# Patient Record
Sex: Female | Born: 1995 | Race: Black or African American | Hispanic: No | Marital: Single | Smoking: Never smoker
Health system: Southern US, Community
[De-identification: ages and names within clinical notes are randomized; demographics above are authoritative.]

## PROBLEM LIST (undated history)

## (undated) DIAGNOSIS — K219 Gastro-esophageal reflux disease without esophagitis: Secondary | ICD-10-CM

---
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
## 2014-11-16 ENCOUNTER — Emergency Department (HOSPITAL_COMMUNITY)
Admission: EM | Admit: 2014-11-16 | Discharge: 2014-11-16 | Disposition: A | Discharge status: Home / Self Care | Payer: 59 | Attending: Emergency Medicine | Admitting: Emergency Medicine

## 2014-11-16 ENCOUNTER — Emergency Department (HOSPITAL_COMMUNITY): Payer: 59

## 2014-11-16 ENCOUNTER — Encounter (HOSPITAL_COMMUNITY): Payer: Self-pay | Admitting: *Deleted

## 2014-11-16 DIAGNOSIS — K5900 Constipation, unspecified: Secondary | ICD-10-CM | POA: Insufficient documentation

## 2014-11-16 DIAGNOSIS — R109 Unspecified abdominal pain: Secondary | ICD-10-CM | POA: Diagnosis present

## 2014-11-16 DIAGNOSIS — Z3202 Encounter for pregnancy test, result negative: Secondary | ICD-10-CM | POA: Diagnosis not present

## 2014-11-16 DIAGNOSIS — K219 Gastro-esophageal reflux disease without esophagitis: Secondary | ICD-10-CM | POA: Insufficient documentation

## 2014-11-16 DIAGNOSIS — IMO0001 Reserved for inherently not codable concepts without codable children: Secondary | ICD-10-CM

## 2014-11-16 HISTORY — DX: Gastro-esophageal reflux disease without esophagitis: K21.9

## 2014-11-16 LAB — URINALYSIS, ROUTINE W REFLEX MICROSCOPIC
Bilirubin Urine: NEGATIVE
GLUCOSE, UA: NEGATIVE mg/dL
HGB URINE DIPSTICK: NEGATIVE
Ketones, ur: NEGATIVE mg/dL
LEUKOCYTES UA: NEGATIVE
NITRITE: NEGATIVE
Protein, ur: NEGATIVE mg/dL
SPECIFIC GRAVITY, URINE: 1.022 (ref 1.005–1.030)
UROBILINOGEN UA: 1 mg/dL (ref 0.0–1.0)
pH: 6 (ref 5.0–8.0)

## 2014-11-16 LAB — POC URINE PREG, ED: Preg Test, Ur: NEGATIVE

## 2014-11-16 MED ORDER — DICYCLOMINE HCL 10 MG/ML IM SOLN
20.0000 mg | Freq: Once | INTRAMUSCULAR | Status: AC
  Administered 2014-11-16: 20 mg via INTRAMUSCULAR
  Filled 2014-11-16: qty 2

## 2014-11-16 MED ORDER — OMEPRAZOLE 20 MG PO CPDR: 20.0000 mg | DELAYED_RELEASE_CAPSULE | Freq: Every day | ORAL | Status: DC

## 2014-11-16 MED ORDER — GI COCKTAIL ~~LOC~~
30.0000 mL | Freq: Once | ORAL | Status: AC
  Administered 2014-11-16: 30 mL via ORAL
  Filled 2014-11-16: qty 30

## 2014-11-16 NOTE — ED Notes (Signed)
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 C/o abd cramping x 15 minutes prior to calling EMS. Patient denies any chance of pregnancy. Denies medical problems. Denies N/V/D.

## 2014-11-16 NOTE — ED Notes (Signed)
Bed: WA09 Expected date:  Expected time:  Means of arrival:  Comments: abd pain 

## 2014-11-16 NOTE — ED Provider Notes (Signed)
CSN: [id number]     Arrival date & time 11/16/14  0213 History  By signing my name below, I, Emmanuella Mensah, attest that this documentation has been prepared under the direction and in the presence of Azul Coffie, MD. Electronically Signed: Angelene GiovanniEmmanuella Mensah, ED Scribe. 11/16/2014. 3:07 AM.   Chief Complaint  Patient presents with  . Abdominal Cramping   Patient is a 19 y.o. female presenting with cramps. The history is provided by the patient. No language interpreter was used.  Abdominal Cramping This is a new problem. The current episode started 2 days ago. The problem has been gradually worsening. Associated symptoms include abdominal pain. Pertinent negatives include no shortness of breath. The symptoms are aggravated by eating. Nothing relieves the symptoms. She has tried nothing for the symptoms.   HPI Comments: Lauralee EvenerDiamond Menning is a 19 y.o. female with a hx of Acid reflux disease who presents to the Emergency Department complaining of a gradually worsening intermittent cramping abdominal pain onset 2 days ago. She denies any n/v/d. Pt reports that she has not been on her GERD medication for years. She denies straining during her last BM several hours ago. She reports that she had a beans and rice, philly cheese steak, fried chicken, and cabbage. No alleviating factors noted.   Past Medical History  Diagnosis Date  . Acid reflux disease    History reviewed. No pertinent past surgical history. Family History  Problem Relation Age of Onset  . Thyroid disease Mother   . Diabetes Father   . Stroke Paternal Grandmother   . Diabetes Paternal Grandmother    Social History  Substance Use Topics  . Smoking status: Never Smoker   . Smokeless tobacco: None  . Alcohol Use: None   OB History    No data available     Review of Systems  Constitutional: Negative for fever.  Respiratory: Negative for shortness of breath.   Gastrointestinal: Positive for abdominal pain. Negative for  nausea, vomiting and diarrhea.  Genitourinary: Negative for dysuria and hematuria.  All other systems reviewed and are negative.     Allergies  Review of patient's allergies indicates not on file.  Home Medications   Prior to Admission medications   Not on File   BP 117/62 mmHg  Pulse 87  Temp(Src) 98.1 F (36.7 C) (Oral)  Resp 16  SpO2 100% Physical Exam  Constitutional: She is oriented to person, place, and time. She appears well-developed and well-nourished. No distress.  HENT:  Head: Normocephalic and atraumatic.  Mouth/Throat: Oropharynx is clear and moist. No oropharyngeal exudate.  Eyes: Conjunctivae and EOM are normal. Pupils are equal, round, and reactive to light.  Neck: Neck supple. No tracheal deviation present.  Cardiovascular: Normal rate and regular rhythm.   Pulmonary/Chest: Effort normal and breath sounds normal. No respiratory distress.  Abdominal: Soft. There is no rebound and no guarding.  Gas Constipation in the transverse colon  Musculoskeletal: Normal range of motion.  Neurological: She is alert and oriented to person, place, and time.  Skin: Skin is warm and dry.  Psychiatric: She has a normal mood and affect. Her behavior is normal.  Nursing note and vitals reviewed.   ED Course  Procedures (including critical care time) DIAGNOSTIC STUDIES: Oxygen Saturation is 100% on RA, normal by my interpretation.    COORDINATION OF CARE: 3:05 AM- Pt advised of plan for treatment and pt agrees.   Labs Review Labs Reviewed  URINALYSIS, ROUTINE W REFLEX MICROSCOPIC (NOT AT [facility])  POC  URINE PREG, ED   Yardley Beltran, MD has personally reviewed and evaluated these lab results as part of her medical decision-making.   EKG Interpretation None      MDM   Final diagnoses:  None   Results for orders placed or performed during the hospital encounter of 11/16/14  Urinalysis, Routine w reflex microscopic (not at [facility])  Result Value Ref Range   Color,  Urine YELLOW YELLOW   APPearance CLEAR CLEAR   Specific Gravity, Urine 1.022 1.005 - 1.030   pH 6.0 5.0 - 8.0   Glucose, UA NEGATIVE NEGATIVE mg/dL   Hgb urine dipstick NEGATIVE NEGATIVE   Bilirubin Urine NEGATIVE NEGATIVE   Ketones, ur NEGATIVE NEGATIVE mg/dL   Protein, ur NEGATIVE NEGATIVE mg/dL   Urobilinogen, UA 1.0 0.0 - 1.0 mg/dL   Nitrite NEGATIVE NEGATIVE   Leukocytes, UA NEGATIVE NEGATIVE  POC Urine Pregnancy, ED (do NOT order at [facility])  Result Value Ref Range   Preg Test, Ur NEGATIVE NEGATIVE   Dg Abd Acute W/chest  11/16/2014  CLINICAL DATA:  Cramping abdominal pain for 2 days, worse this morning. History of gastric reflux. EXAM: DG ABDOMEN ACUTE W/ 1V CHEST COMPARISON:  None. FINDINGS: Normal heart size and pulmonary vascularity. No focal airspace disease or consolidation in the lungs. No blunting of costophrenic angles. No pneumothorax. Mediastinal contours appear intact. Scattered gas and stool in the colon. No small or large bowel distention. No free intra-abdominal air. No abnormal air-fluid levels. No radiopaque stones. Visualized bones appear intact. IMPRESSION: No evidence of active pulmonary disease. Normal nonobstructive bowel gas pattern. Electronically Signed   By: Burman Nieves M.D.   On: 11/16/2014 03:40      Medications  gi cocktail (Maalox,Lidocaine,Donnatal) (30 mLs Oral Given 11/16/14 0335)  dicyclomine (BENTYL) injection 20 mg (20 mg Intramuscular Given 11/16/14 0335)  GERD with excessive gas likely related to diet.  Will restart omeprazole and have advised gas x and gerd friendly diet.  Strict return precautions.    I personally performed the services described in this documentation, which was scribed in my presence. The recorded information has been reviewed and is accurate.     Cy Blamer, MD 11/16/14 [phone]

## 2017-06-26 IMAGING — CR DG ABDOMEN ACUTE W/ 1V CHEST
3 series · 3 of 3 positions shown · non-contrast
Comparison: None.

CLINICAL DATA: Cramping abdominal pain for 2 days, worse this
morning. History of gastric reflux.

EXAM:
DG ABDOMEN ACUTE W/ 1V CHEST

[w chest pa]
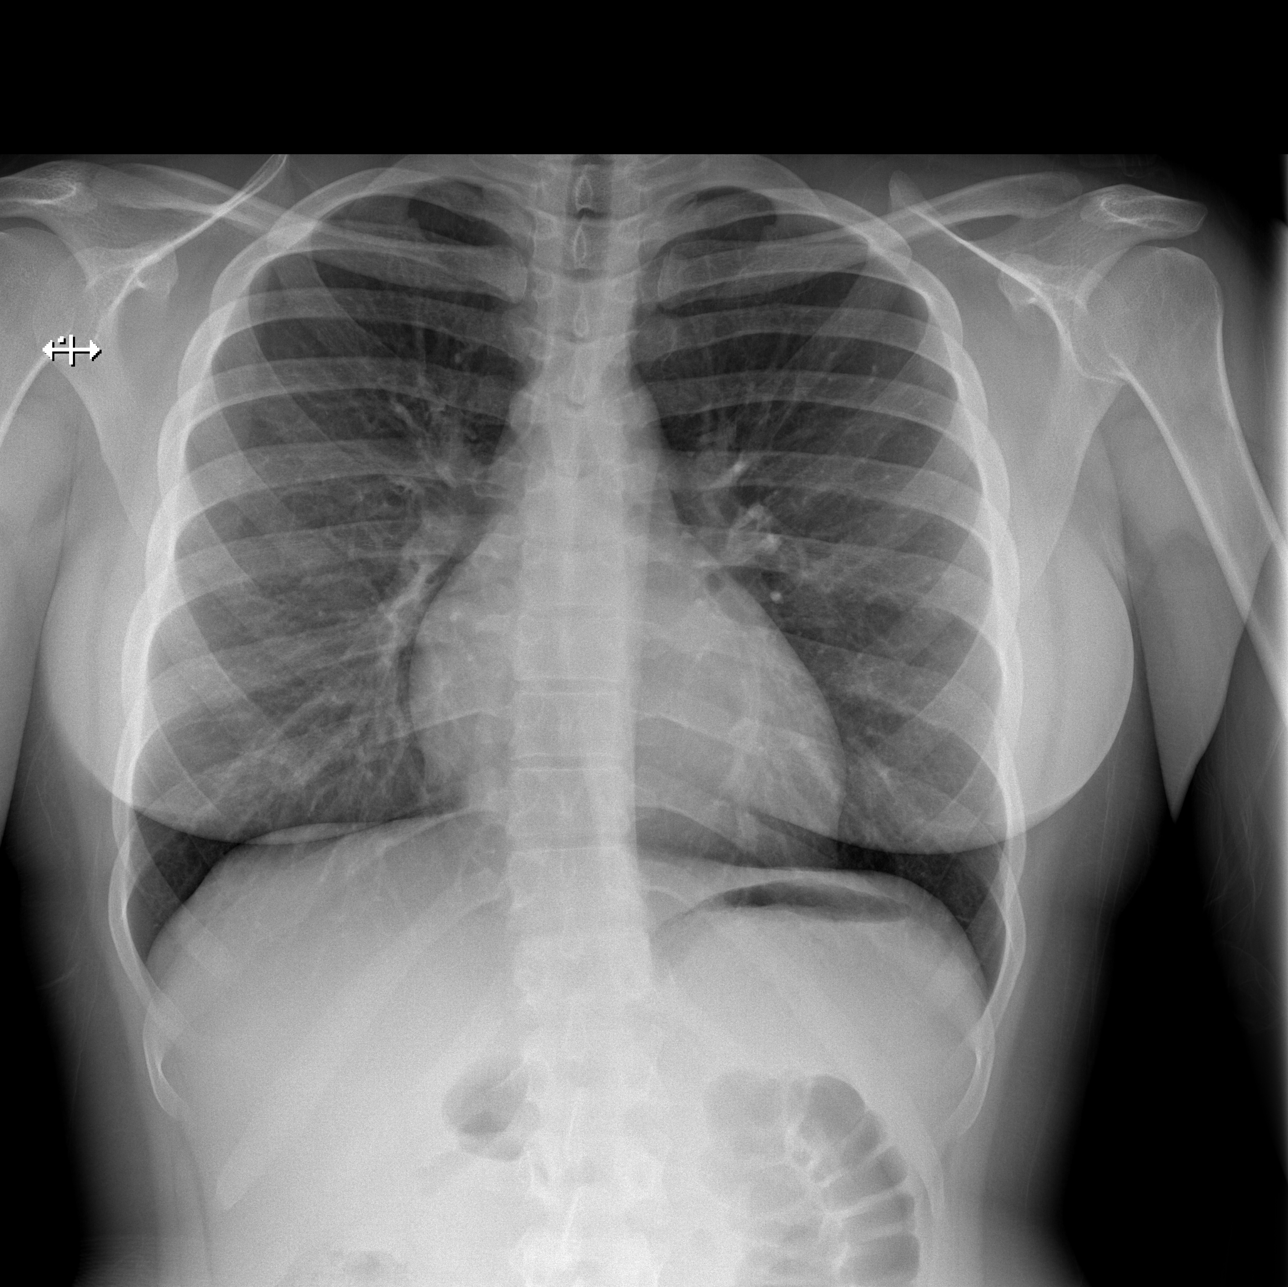

[w abdomen upright]
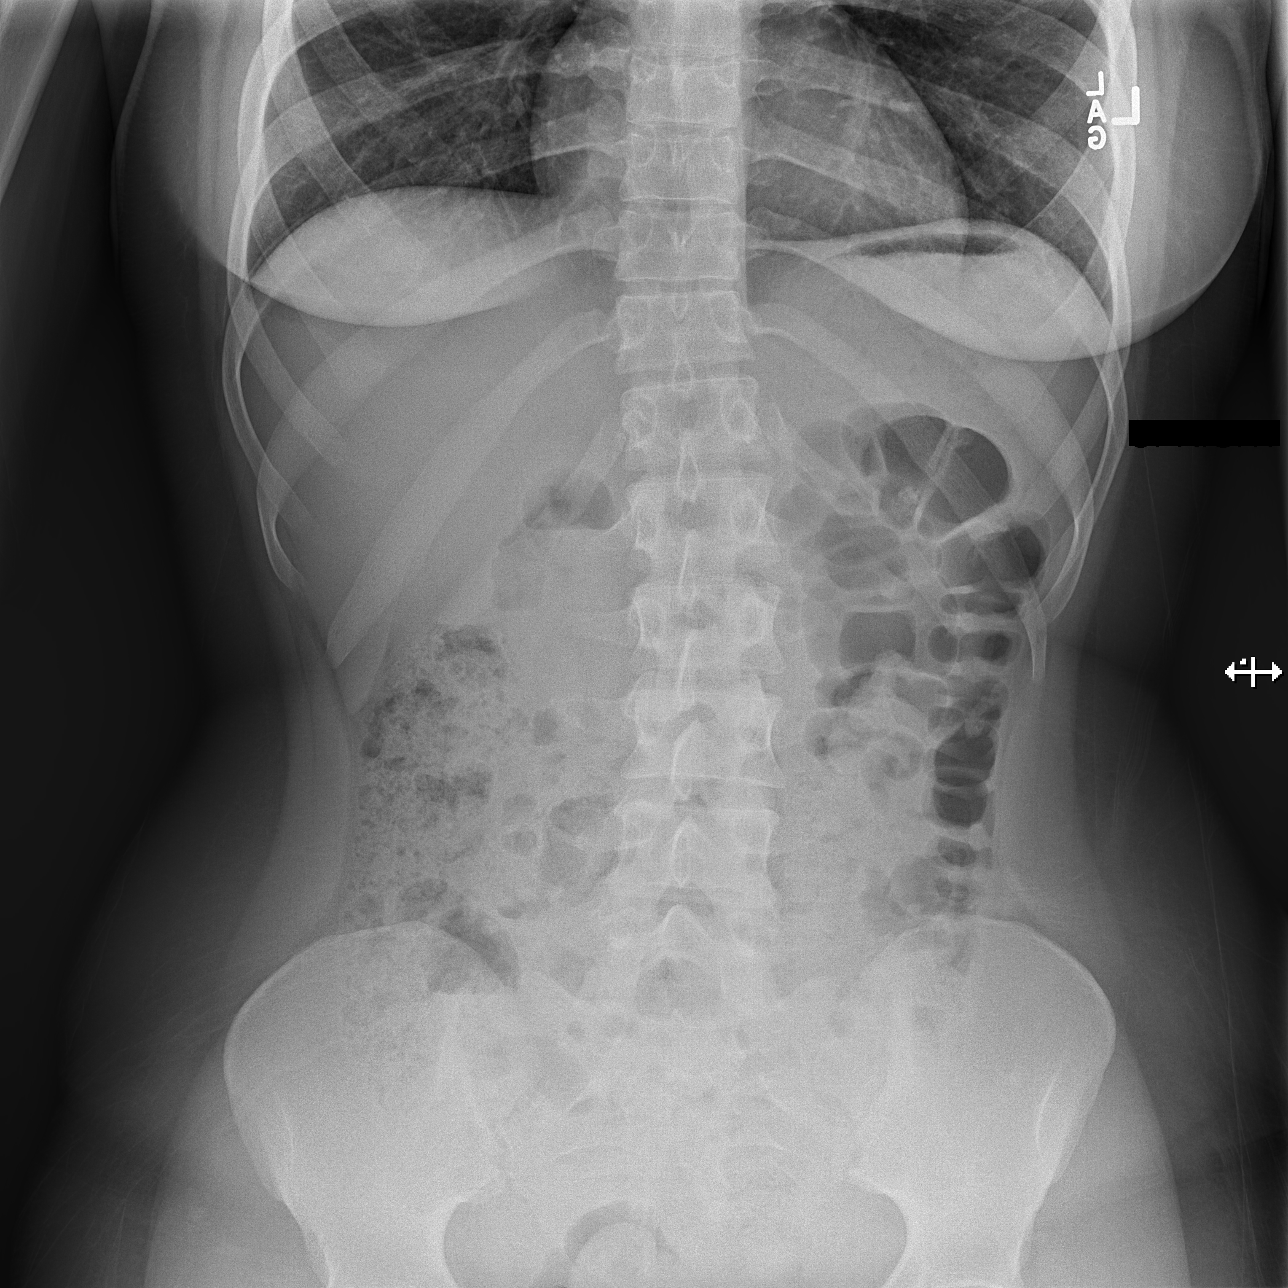

[t abdomen supine]
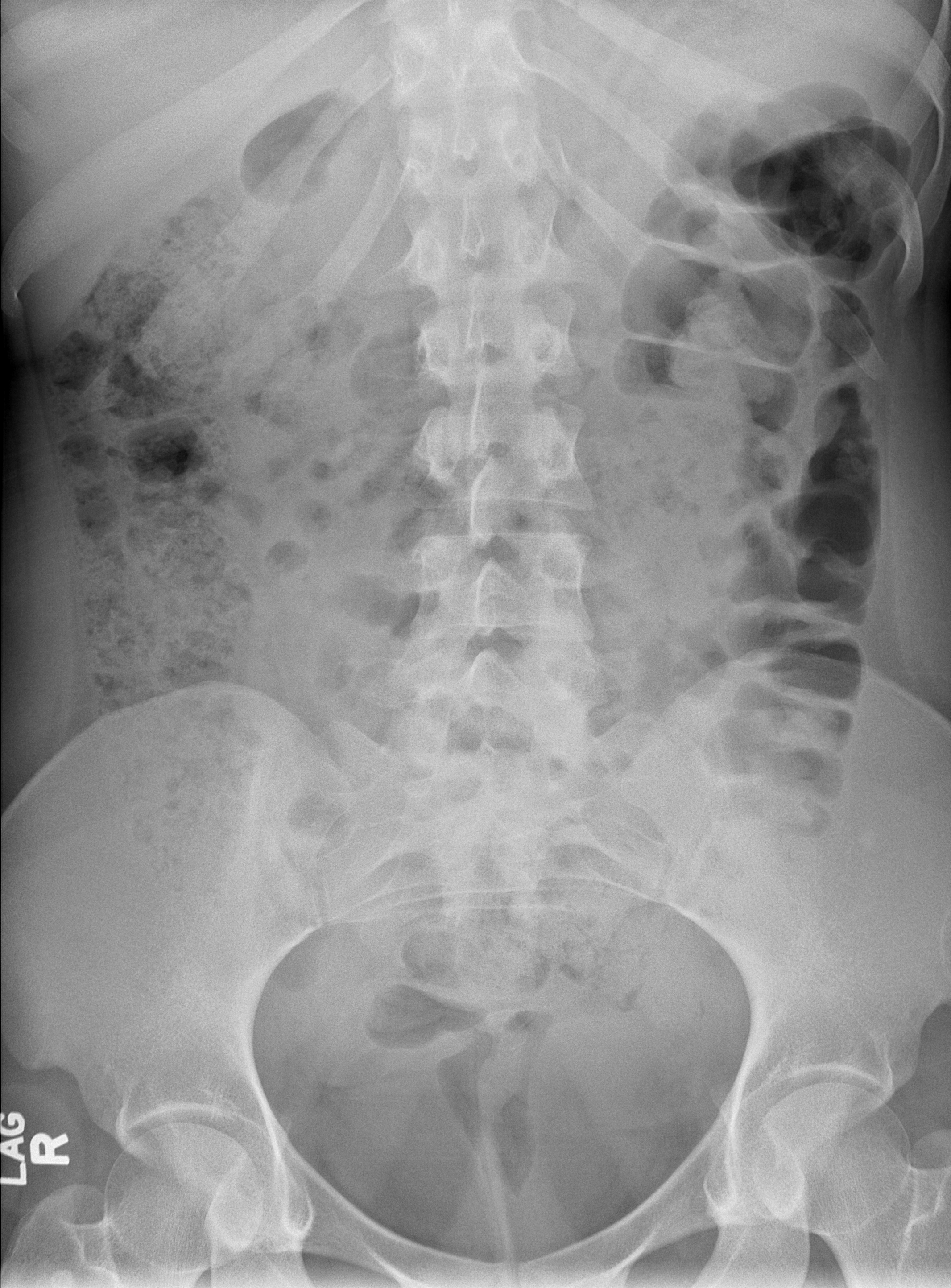

[3 of 3 positions shown; findings below may reference images not displayed]

FINDINGS: Normal heart size and pulmonary vascularity. No focal airspace
disease or consolidation in the lungs. No blunting of costophrenic
angles. No pneumothorax. Mediastinal contours appear intact.

Scattered gas and stool in the colon. No small or large bowel
distention. No free intra-abdominal air. No abnormal air-fluid
levels. No radiopaque stones. Visualized bones appear intact.
IMPRESSION: No evidence of active pulmonary disease. Normal nonobstructive bowel
gas pattern.

## 2018-02-24 ENCOUNTER — Ambulatory Visit (HOSPITAL_COMMUNITY)
Admission: EM | Admit: 2018-02-24 | Discharge: 2018-02-24 | Disposition: A | Discharge status: Home / Self Care | Payer: 59 | Attending: Family | Admitting: Family

## 2018-02-24 ENCOUNTER — Encounter (HOSPITAL_COMMUNITY): Payer: Self-pay | Admitting: Emergency Medicine

## 2018-02-24 DIAGNOSIS — F419 Anxiety disorder, unspecified: Secondary | ICD-10-CM

## 2018-02-24 DIAGNOSIS — F410 Panic disorder [episodic paroxysmal anxiety] without agoraphobia: Secondary | ICD-10-CM

## 2018-02-24 DIAGNOSIS — G44221 Chronic tension-type headache, intractable: Secondary | ICD-10-CM

## 2018-02-24 MED ORDER — ESCITALOPRAM OXALATE 10 MG PO TABS: 10.0000 mg | ORAL_TABLET | Freq: Every day | ORAL | 0 refills | Status: DC

## 2018-02-24 NOTE — Discharge Instructions (Signed)
Recommend trial Lexapro 10mg  tablet - take one tablet daily with food. Review stress-reducing techniques. Try to limit caffeine intake. May take Aleve as needed for headaches. Recommend contact your PCP or GYN for re-evaluation in 3 to 4 weeks.

## 2018-02-24 NOTE — ED Triage Notes (Signed)
Pt states "I had a really bad panic attack at work today, i've had small ones but this one was bad". Pt appears in NAD.

## 2018-02-24 NOTE — ED Provider Notes (Signed)
[facility]    CSN: [id number] Arrival date & time: 02/24/18  1028     History   Chief Complaint Chief Complaint  Patient presents with  . Anxiety    HPI Vanessa Bender is a 23 y.o. female.   23 year old female presents with panic attack that occurred at work this morning. 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 There are only a few workers and many tasks and she often feels overwhelmed. Today- she started feeling stressed and started crying. Then she hyperventilated and was hot and shaking. She also experienced palpitations and a dull headache. Symptoms lasted for at least 30 minutes except headache which she is still experiencing. This has been her longest panic attack. Previous panic attacks have lasted about 20 minutes but she has had 2 in the past 3 months. She denies any vision changes, chest pain, abdominal pain or nausea. She does have a history of chronic tension headaches. She has tested positive on a depression scale at her last GYN routine visit and she was offered Prozac in the past but she never took it. She is concerned about side effects with any medication. She has multiple questions about anxiety and mood and various options for treatment. No other health issues. Takes no daily medication. She does not use tobacco or drink alcohol but does consume moderate amount of caffeine daily. No illicit drug use.   The history is provided by the patient.    Past Medical History:  Diagnosis Date  . Acid reflux disease     There are no active problems to display for this patient.   History reviewed. No pertinent surgical history.  OB History   No obstetric history on file.      Home Medications    Prior to Admission medications   Medication Sig Start Date End Date Taking? Authorizing Provider  escitalopram (LEXAPRO) 10 MG tablet Take 1 tablet (10 mg total) by mouth daily. 02/24/18   Sudie GrumblingAmyot, Mabel Roll Berry,  NP  ibuprofen (ADVIL,MOTRIN) 200 MG tablet Take 400 mg by mouth every 8 (eight) hours as needed (for  pain.).    [provider]    Family History Family History  Problem Relation Age of Onset  . Thyroid disease Mother   . Diabetes Father   . Stroke Paternal Grandmother   . Diabetes Paternal Grandmother     Social History Social History   Tobacco Use  . Smoking status: Never Smoker  Substance Use Topics  . Alcohol use: Not on file  . Drug use: Not on file     Allergies   Patient has no known allergies.   Review of Systems Review of Systems  Constitutional: Negative for activity change, appetite change, chills, diaphoresis, fatigue and fever.  HENT: Negative for congestion, facial swelling, mouth sores, nosebleeds, postnasal drip, sore throat, tinnitus and trouble swallowing.   Eyes: Negative for photophobia and visual disturbance.  Respiratory: Positive for shortness of breath (hyperventilating during panic attack). Negative for cough, chest tightness and wheezing.   Cardiovascular: Positive for palpitations. Negative for chest pain and leg swelling.  Gastrointestinal: Negative for abdominal pain, diarrhea, nausea and vomiting.  Musculoskeletal: Negative for arthralgias, back pain, myalgias, neck pain and neck stiffness.  Skin: Negative for color change, rash and wound.  Allergic/Immunologic: Negative for environmental allergies, food allergies and immunocompromised state.  Neurological: Positive for light-headedness and headaches. Negative for dizziness, tremors, seizures, syncope, facial asymmetry,  speech difficulty, weakness and numbness.  Hematological: Negative for adenopathy. Does not bruise/bleed easily.  Psychiatric/Behavioral: Negative for hallucinations, self-injury, sleep disturbance and suicidal ideas. The patient is nervous/anxious.      Physical Exam Triage Vital Signs ED Triage Vitals  Enc Vitals Group     BP 02/24/18 1123 136/73     Pulse  Rate 02/24/18 1123 88     Resp 02/24/18 1123 16     Temp 02/24/18 1123 97.8 F (36.6 C)     Temp src --      SpO2 02/24/18 1123 100 %     Weight --      Height --      Head Circumference --      Peak Flow --      Pain Score 02/24/18 1124 0     Pain Loc --      Pain Edu? --      Excl. in GC? --    No data found.  Updated Vital Signs BP 136/73   Pulse 88   Temp 97.8 F (36.6 C)   Resp 16   LMP 02/10/2018   SpO2 100%   Visual Acuity Right Eye Distance:   Left Eye Distance:   Bilateral Distance:    Right Eye Near:   Left Eye Near:    Bilateral Near:     Physical Exam Vitals signs and nursing note reviewed.  Constitutional:      General: She is awake. She is not in acute distress.    Appearance: Normal appearance. She is well-developed, well-groomed and normal weight. She is not ill-appearing.  HENT:     Head: Normocephalic and atraumatic.     Right Ear: Hearing and external ear normal.     Left Ear: Hearing and external ear normal.     Nose: Nose normal.     Mouth/Throat:     Lips: Pink.     Mouth: Mucous membranes are moist.     Pharynx: Oropharynx is clear. Uvula midline. No pharyngeal swelling.  Eyes:     General: Lids are normal. Vision grossly intact. Gaze aligned appropriately.     Extraocular Movements: Extraocular movements intact.     Conjunctiva/sclera: Conjunctivae normal.     Pupils: Pupils are equal, round, and reactive to light.  Neck:     Musculoskeletal: Normal range of motion and neck supple. No neck rigidity or muscular tenderness.  Cardiovascular:     Rate and Rhythm: Normal rate and regular rhythm.     Pulses: Normal pulses.     Heart sounds: Normal heart sounds. No murmur.  Pulmonary:     Effort: Pulmonary effort is normal. No respiratory distress.     Breath sounds: Normal breath sounds and air entry. No decreased air movement. No decreased breath sounds, wheezing, rhonchi or rales.  Musculoskeletal: Normal range of motion.  Skin:     General: Skin is warm and dry.     Capillary Refill: Capillary refill takes less than 2 seconds.  Neurological:     General: No focal deficit present.     Mental Status: She is alert and oriented to person, place, and time.     Cranial Nerves: Cranial nerves are intact.     Sensory: Sensation is intact.     Motor: Motor function is intact.     Gait: Gait is intact.  Psychiatric:        Attention and Perception: Attention normal.        Mood and Affect:  Mood and affect normal.        Speech: Speech normal.        Behavior: Behavior normal. Behavior is cooperative.        Thought Content: Thought content normal.        Cognition and Memory: Cognition normal.        Judgment: Judgment normal.      UC Treatments / Results  Labs (all labs ordered are listed, but only abnormal results are displayed) Labs Reviewed - No data to display  EKG None  Radiology No results found.  Procedures Procedures (including critical care time)  Medications Ordered in UC Medications - No data to display  Initial Impression / Assessment and Plan / UC Course  I have reviewed the triage vital signs and the nursing notes.  Pertinent labs & imaging results that were available during my care of the patient were reviewed by me and considered in my medical decision making (see chart for details).    Discussed panic attacks, anxiety disorder and chronic headaches due to stress, anxiety and depression. No emergent need for behavorial health intervention. Patient is stable with no suicidal or homicidal tendencies. Reviewed various treatment options for patient today. Discussed that we usually trial Hydroxyzine for anxiety but patient concerned about side effects- particularly drowsiness. Has no difficulty falling and staying asleep. Discussed psychotherapy can be helpful. Reviewed various anti-anxiety and anti-depressants medications. Will trial Lexapro 10mg  once daily- 30 tablets prescribed. Discussed that  she will need to make an appointment with her PCP or her GYN for further management and evaluation of anxiety and depression. Provided information on stress management and relaxation techniques. Discussed decreasing caffeine consumption. May take OTC Aleve every 12 hours as needed for headaches. Call her PCP or GYN today to schedule appointment for follow-up and referral for psychotherapy or go to the ER if symptoms worsen.  Final Clinical Impressions(s) / UC Diagnoses   Final diagnoses:  Panic attack  Anxiety  Chronic tension-type headache, intractable     Discharge Instructions     Recommend trial Lexapro 10mg  tablet - take one tablet daily with food. Review stress-reducing techniques. Try to limit caffeine intake. May take Aleve as needed for headaches. Recommend contact your PCP or GYN for re-evaluation in 3 to 4 weeks.     ED Prescriptions    Medication Sig Dispense Auth. Provider   escitalopram (LEXAPRO) 10 MG tablet Take 1 tablet (10 mg total) by mouth daily. 30 tablet Sudie Grumbling, NP     Controlled Substance Prescriptions  Controlled Substance Registry consulted? Not Applicable   Sudie Grumbling, NP 02/24/18 2237

## 2018-03-26 ENCOUNTER — Ambulatory Visit (INDEPENDENT_AMBULATORY_CARE_PROVIDER_SITE_OTHER): Payer: 59 | Admitting: Psychology

## 2018-03-26 DIAGNOSIS — F319 Bipolar disorder, unspecified: Secondary | ICD-10-CM | POA: Diagnosis not present

## 2018-04-05 ENCOUNTER — Ambulatory Visit (INDEPENDENT_AMBULATORY_CARE_PROVIDER_SITE_OTHER): Payer: 59 | Admitting: Psychology

## 2018-04-05 DIAGNOSIS — F3189 Other bipolar disorder: Secondary | ICD-10-CM | POA: Diagnosis not present

## 2018-04-07 ENCOUNTER — Inpatient Hospital Stay (HOSPITAL_COMMUNITY)
Admission: RE | Admit: 2018-04-07 | Discharge: 2018-04-10 | DRG: 885 | Disposition: A | Discharge status: Home / Self Care | Payer: 59 | Attending: Psychiatry | Admitting: Psychiatry

## 2018-04-07 ENCOUNTER — Encounter (HOSPITAL_COMMUNITY): Payer: Self-pay

## 2018-04-07 ENCOUNTER — Other Ambulatory Visit: Payer: Self-pay

## 2018-04-07 DIAGNOSIS — Z79899 Other long term (current) drug therapy: Secondary | ICD-10-CM

## 2018-04-07 DIAGNOSIS — F322 Major depressive disorder, single episode, severe without psychotic features: Secondary | ICD-10-CM | POA: Diagnosis not present

## 2018-04-07 DIAGNOSIS — Z818 Family history of other mental and behavioral disorders: Secondary | ICD-10-CM

## 2018-04-07 DIAGNOSIS — F332 Major depressive disorder, recurrent severe without psychotic features: Principal | ICD-10-CM | POA: Diagnosis present

## 2018-04-07 DIAGNOSIS — F411 Generalized anxiety disorder: Secondary | ICD-10-CM | POA: Diagnosis present

## 2018-04-07 DIAGNOSIS — K219 Gastro-esophageal reflux disease without esophagitis: Secondary | ICD-10-CM | POA: Diagnosis present

## 2018-04-07 DIAGNOSIS — G4700 Insomnia, unspecified: Secondary | ICD-10-CM | POA: Diagnosis present

## 2018-04-07 DIAGNOSIS — F4310 Post-traumatic stress disorder, unspecified: Secondary | ICD-10-CM | POA: Diagnosis present

## 2018-04-07 DIAGNOSIS — F3189 Other bipolar disorder: Secondary | ICD-10-CM | POA: Diagnosis not present

## 2018-04-07 MED ORDER — TRAZODONE HCL 50 MG PO TABS
50.0000 mg | ORAL_TABLET | Freq: Every evening | ORAL | Status: DC | PRN
  Administered 2018-04-07 – 2018-04-09 (×3): 50 mg via ORAL
  Filled 2018-04-07 (×13): qty 1

## 2018-04-07 MED ORDER — HYDROXYZINE HCL 25 MG PO TABS
25.0000 mg | ORAL_TABLET | Freq: Four times a day (QID) | ORAL | Status: DC | PRN
  Administered 2018-04-07: 25 mg via ORAL
  Filled 2018-04-07: qty 1

## 2018-04-07 MED ORDER — ALUM & MAG HYDROXIDE-SIMETH 200-200-20 MG/5ML PO SUSP: 30.0000 mL | ORAL | Status: DC | PRN

## 2018-04-07 MED ORDER — ARIPIPRAZOLE 5 MG PO TABS
5.0000 mg | ORAL_TABLET | Freq: Every day | ORAL | Status: DC
  Filled 2018-04-07 (×2): qty 1

## 2018-04-07 MED ORDER — MAGNESIUM HYDROXIDE 400 MG/5ML PO SUSP: 30.0000 mL | Freq: Every day | ORAL | Status: DC | PRN

## 2018-04-07 MED ORDER — ACETAMINOPHEN 325 MG PO TABS: 650.0000 mg | ORAL_TABLET | Freq: Four times a day (QID) | ORAL | Status: DC | PRN

## 2018-04-07 NOTE — Tx Team (Signed)
Initial Treatment Plan 04/07/2018 11:15 PM Angelika Dermer JQB:[id number]    PATIENT STRESSORS: Marital or family conflict Traumatic event   PATIENT STRENGTHS: Ability for insight Average or above average intelligence Communication skills General fund of knowledge Motivation for treatment/growth Physical Health Special hobby/interest Supportive family/friends Work skills   PATIENT IDENTIFIED PROBLEMS: "At risk of suicide"   "Depression"   "Anxiety"   "insomnia"                DISCHARGE CRITERIA:  Ability to meet basic life and health needs Improved stabilization in mood, thinking, and/or behavior Verbal commitment to aftercare and medication compliance  PRELIMINARY DISCHARGE PLAN: Attend PHP/IOP Outpatient therapy Return to previous living arrangement Return to previous work or school arrangements  PATIENT/FAMILY INVOLVEMENT: This treatment plan has been presented to and reviewed with the patient, [employer]. The patient have been given the opportunity to ask questions and make suggestions.  Tyrone Apple, RN 04/07/2018, 11:15 PM

## 2018-04-07 NOTE — Progress Notes (Signed)
UA collected. Pending results.  
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
## 2018-04-07 NOTE — H&P (Signed)
Behavioral Health Medical Screening Exam  Vanessa Bender is an 23 y.o. female, presents via POV with c/o of exacerbated depressive sx over 2 months duration. She is not taking medications at this time. She endorses racing thoughts, irregular sleep pattern, rapid cycling of mood and restlessness.   Total Time spent with patient: 15 minutes  Psychiatric Specialty Exam: Physical Exam  Constitutional: She is oriented to person, place, and time. She appears well-developed and well-nourished. No distress.  HENT:  Head: Normocephalic.  Eyes: Pupils are equal, round, and reactive to light.  Respiratory: Effort normal and breath sounds normal. No respiratory distress.  Neurological: She is alert and oriented to person, place, and time. No cranial nerve deficit.  Skin: Skin is warm and dry. She is not diaphoretic.  Psychiatric: Her speech is rapid and/or pressured. Cognition and memory are impaired. She expresses impulsivity. She exhibits a depressed mood. She expresses suicidal ideation. She expresses no homicidal ideation. She expresses suicidal plans. She expresses no homicidal plans.    Review of Systems  Constitutional: Negative for chills, diaphoresis, fever, malaise/fatigue and weight loss.  Psychiatric/Behavioral: Positive for depression and suicidal ideas. The patient is nervous/anxious.     There were no vitals taken for this visit.There is no height or weight on file to calculate BMI.  General Appearance: Casual  Eye Contact:  Fair  Speech:  Clear and Coherent  Volume:  Normal  Mood:  Anxious and Depressed  Affect:  Congruent  Thought Process:  Goal Directed  Orientation:  Full (Time, Place, and Person)  Thought Content:  Logical  Suicidal Thoughts:  Yes.  with intent/plan  Homicidal Thoughts:  No  Memory:  NA Immediate;   Fair  Judgement:  Fair  Insight:  Fair  Psychomotor Activity:  Normal  Concentration: Concentration: Fair  Recall:  [employer] of Knowledge:Fair  Language:  Fair  Akathisia:  Negative  Handed:  Right  AIMS (if indicated):     Assets:  Social Support  Sleep:       Musculoskeletal: Strength & Muscle Tone: within normal limits Gait & Station: normal Patient leans: N/A  There were no vitals taken for this visit.  Recommendations:  Based on my evaluation the patient does not appear to have an emergency medical condition.  Kerry Hough, PA-C 04/07/2018, 8:14 PM
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
## 2018-04-08 DIAGNOSIS — F3189 Other bipolar disorder: Secondary | ICD-10-CM

## 2018-04-08 DIAGNOSIS — F411 Generalized anxiety disorder: Secondary | ICD-10-CM

## 2018-04-08 DIAGNOSIS — F4310 Post-traumatic stress disorder, unspecified: Secondary | ICD-10-CM

## 2018-04-08 LAB — CBC
HCT: 42.6 % (ref 36.0–46.0)
Hemoglobin: 13.6 g/dL (ref 12.0–15.0)
MCH: 27.7 pg (ref 26.0–34.0)
MCHC: 31.9 g/dL (ref 30.0–36.0)
MCV: 86.8 fL (ref 80.0–100.0)
NRBC: 0 % (ref 0.0–0.2)
PLATELETS: 292 10*3/uL (ref 150–400)
RBC: 4.91 MIL/uL (ref 3.87–5.11)
RDW: 11.6 % (ref 11.5–15.5)
WBC: 7 10*3/uL (ref 4.0–10.5)

## 2018-04-08 LAB — URINALYSIS, ROUTINE W REFLEX MICROSCOPIC
Bilirubin Urine: NEGATIVE
Glucose, UA: NEGATIVE mg/dL
Hgb urine dipstick: NEGATIVE
KETONES UR: NEGATIVE mg/dL
Leukocytes,Ua: NEGATIVE
NITRITE: NEGATIVE
PH: 7 (ref 5.0–8.0)
Protein, ur: NEGATIVE mg/dL
Specific Gravity, Urine: 1.005 (ref 1.005–1.030)

## 2018-04-08 LAB — LIPID PANEL
Cholesterol: 203 mg/dL — ABNORMAL HIGH (ref 0–200)
HDL: 53 mg/dL (ref >40)
LDL Cholesterol: 140 mg/dL — ABNORMAL HIGH (ref 0–99)
TRIGLYCERIDES: 52 mg/dL (ref <150)
Total CHOL/HDL Ratio: 3.8 RATIO
VLDL: 10 mg/dL (ref 0–40)

## 2018-04-08 LAB — RAPID URINE DRUG SCREEN, HOSP PERFORMED
AMPHETAMINES: NOT DETECTED
BENZODIAZEPINES: NOT DETECTED
Barbiturates: NOT DETECTED
Cocaine: NOT DETECTED
Opiates: NOT DETECTED
Tetrahydrocannabinol: NOT DETECTED

## 2018-04-08 LAB — COMPREHENSIVE METABOLIC PANEL
ALK PHOS: 116 U/L (ref 38–126)
ALT: 14 U/L (ref 0–44)
AST: 18 U/L (ref 15–41)
Albumin: 4.2 g/dL (ref 3.5–5.0)
Anion gap: 7 (ref 5–15)
BUN: 6 mg/dL (ref 6–20)
CALCIUM: 9.2 mg/dL (ref 8.9–10.3)
CO2: 26 mmol/L (ref 22–32)
CREATININE: 0.62 mg/dL (ref 0.44–1.00)
Chloride: 104 mmol/L (ref 98–111)
Glucose, Bld: 82 mg/dL (ref 70–99)
Potassium: 3.9 mmol/L (ref 3.5–5.1)
Sodium: 137 mmol/L (ref 135–145)
Total Bilirubin: 0.8 mg/dL (ref 0.3–1.2)
Total Protein: 8 g/dL (ref 6.5–8.1)

## 2018-04-08 LAB — TSH: TSH: 1.802 u[IU]/mL (ref 0.350–4.500)

## 2018-04-08 LAB — PREGNANCY, URINE: PREG TEST UR: NEGATIVE

## 2018-04-08 MED ORDER — BUSPIRONE HCL 10 MG PO TABS
10.0000 mg | ORAL_TABLET | Freq: Two times a day (BID) | ORAL | Status: DC
  Administered 2018-04-08 – 2018-04-10 (×5): 10 mg via ORAL
  Filled 2018-04-08 (×2): qty 2
  Filled 2018-04-08 (×2): qty 1
  Filled 2018-04-08: qty 2
  Filled 2018-04-08 (×7): qty 1

## 2018-04-08 MED ORDER — ARIPIPRAZOLE 5 MG PO TABS
5.0000 mg | ORAL_TABLET | Freq: Every day | ORAL | Status: DC
  Administered 2018-04-08: 5 mg via ORAL
  Filled 2018-04-08 (×3): qty 1

## 2018-04-08 MED ORDER — ARIPIPRAZOLE 2 MG PO TABS
2.0000 mg | ORAL_TABLET | Freq: Once | ORAL | Status: AC
  Administered 2018-04-08: 2 mg via ORAL
  Filled 2018-04-08 (×2): qty 1

## 2018-04-08 NOTE — Plan of Care (Signed)
  Problem: Activity: Goal: Imbalance in normal sleep/wake cycle will improve Outcome: Progressing; Patient reported adequate sleep.    Problem: Coping: Goal: Coping ability will improve Outcome: Progressing; Patient was able to cope with medication change in a calm, cooperative manner.  Goal: Will verbalize feelings Outcome: Progressing; Patient was able to verbalize her concern over med change.
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
## 2018-04-08 NOTE — BHH Suicide Risk Assessment (Signed)
B[facility] INPATIENT:  Family/Significant Other Suicide Prevention Education  Suicide Prevention Education:   Patient Refusal for Family/Significant Other Suicide Prevention Education: The patient Vanessa Bender has refused to provide written consent for family/significant other to be provided Family/Significant Other Suicide Prevention Education during admission and/or prior to discharge.  Physician notified.  SPE completed with patient, as patient refused to consent to family contact. SPI pamphlet provided to pt and pt was encouraged to share information with support network, ask questions, and talk about any concerns relating to SPE. Patient denies access to guns/firearms and verbalized understanding of information provided. Mobile Crisis information also provided to patient.    Maeola Sarah 04/08/2018, 9:51 AM

## 2018-04-08 NOTE — Progress Notes (Signed)
Patient ID: Vanessa Bender, female   DOB: 13-May-1995, 23 y.o.   MRN: [id number] D: Patient in room on approach. Pt reports feeling tired and dizzy after waking up. B/P within normal limit. Pt given cup of ginger ale, snack refused. Pt helped back to bed. Pt mood and affect appeared depressed and flat. Pt denies SI/HI/AVH and pain. Calm and cooperative with assessment. No acute distressed noted at this time.   A: Medications administered as prescribed. Support and encouragement provided to attend groups and engage in milieu. Pt encouraged to discuss feelings and come to staff with any question or concerns.   R: Patient remains safe and complaint with medications.

## 2018-04-08 NOTE — Progress Notes (Signed)
On initial approach, patient is seen eating breakfast in her room (per new COVID-19 precautions on the entire unit). Patient presents with anxious mood. Patient questioned new prescription for Abilify, as she is used to Prozac, and volunteered to speak with Do. Clary before acception her medication. Patient currently denies SI/HI/AVH.    Patient is educated about and provided medication per provider's orders. Patient safety maintained with q15 min safety checks and low fall risk precautions. Emotional support given, 1:1 interaction, and active listening provided. Patient encouraged to attend meals, groups, and work on treatment plan and goals. Labs, vital signs and patient behavior monitored throughout shift.    Patient contracts for safety with staff. Patient remains safe on the unit at this time and agrees to come to staff with any issues/concerns. Will continue to support and monitor.

## 2018-04-08 NOTE — [facility] Suicide Risk Assessment (Signed)
[facility] Admission Suicide Risk Assessment   Nursing information obtained from:    Demographic factors:  Adolescent or young adult, Living alone Current Mental Status:  NA Loss Factors:  NA Historical Factors:  NA Risk Reduction Factors:  Positive social support, Sense of responsibility to family, Employed  Total Time spent with patient: 30 minutes Principal Problem: <principal problem not specified> Diagnosis:  Active Problems:   MDD (major depressive disorder), severe (HCC)  Subjective Data: Patient is seen and examined.  Patient is a 23 year old female with a reported past psychiatric history significant for anxiety, depression, probable posttraumatic stress disorder who presented to the behavioral health hospital directly on 04/07/2018 with suicidal ideation.  Patient stated that she had a history of depression anxiety since she was approximately 23 years old, but things have gotten worse very recently.  She stated her boyfriend who has a history of substance issues, overdosed on some unspecified opiate which led to significant debility to the point where he is now in a pill to be unit in the hospital.  She stated that prior to his overdose she had attempted to get him clean.  He had moved in with her prior to this.  He had reportedly been hospitalized at behavioral health hospital in February.  He overdosed shortly after that.  She stated since then his family has not been understanding towards her, and she has had conflict with family members at the hospital.  She stated that this led to worsening depressive symptoms.  She stated she had thought about jumping off a parking deck.  She stated that she had been previously treated with Lexapro which led to suicidal ideation, and had been placed on fluoxetine by her OB/GYN.  She stated she liked the fluoxetine, and made her feel "with a lot more energy, did not need as much sleep".  She stated she did not feel as though it did anything for her anxiety and  depression but made her have more energy.  She does have a family history of bipolar disorder.  She stated that the fluoxetine she had received was only 10 mg from her OB/GYN.  She stated she had taken that consistently over the last month or so.  She had also been prescribed buspirone, but only took that on an as-needed basis.  She had been referred to psychiatry for evaluation.  She initially saw the [facility] intake person, and that was because of her doctor at that time being in [place].  They had [facility].  She had gone to the [facility] for counseling on 2 occasions, but and had [facility].  She has not been seen there yet.  She denied any previous psychiatric admissions.  She denied any drug use.  She denied any auditory or visual hallucinations.  She was admitted to the hospital for evaluation and stabilization.  Continued Clinical Symptoms:  Alcohol Use Disorder Identification Test Final Score (AUDIT): 0 The "Alcohol Use Disorders Identification Test", Guidelines for Use in Primary Care, Second Edition.  World Science writer [facility]). Score between 0-7:  no or low risk or alcohol related problems. Score between 8-15:  moderate risk of alcohol related problems. Score between 16-19:  high risk of alcohol related problems. Score 20 or above:  warrants further diagnostic evaluation for alcohol dependence and treatment.   CLINICAL FACTORS:   Severe Anxiety and/or Agitation Bipolar Disorder:   Depressive phase Depression:   Anhedonia Hopelessness Impulsivity  Insomnia   Musculoskeletal: Strength & Muscle Tone: within normal limits Gait & Station: normal Patient leans: N/A  Psychiatric Specialty Exam: Physical Exam  Nursing note and vitals reviewed. Constitutional: She is oriented to person, place, and time. She appears well-developed and well-nourished.  HENT:  Head: Normocephalic and atraumatic.   Respiratory: Effort normal.  Neurological: She is alert and oriented to person, place, and time.    ROS  Blood pressure 114/71, pulse 81, temperature 98.9 F (37.2 C), temperature source Oral, resp. rate 20, height 5\' 5"  (1.651 m), weight 93 kg.Body mass index is 34.11 kg/m.  General Appearance: Casual  Eye Contact:  Fair  Speech:  Normal Rate  Volume:  Decreased  Mood:  Depressed  Affect:  Congruent  Thought Process:  Coherent and Descriptions of Associations: Intact  Orientation:  Full (Time, Place, and Person)  Thought Content:  Logical  Suicidal Thoughts:  Yes.  without intent/plan  Homicidal Thoughts:  No  Memory:  Immediate;   Fair Recent;   Fair Remote;   Fair  Judgement:  Intact  Insight:  Fair  Psychomotor Activity:  Psychomotor Retardation  Concentration:  Concentration: Fair and Attention Span: Fair  Recall:  [employer] of Knowledge:  Fair  Language:  Good  Akathisia:  Negative  Handed:  Right  AIMS (if indicated):     Assets:  Communication Skills Desire for Improvement Financial Resources/Insurance Housing Leisure Time Physical Health Resilience  ADL's:  Intact  Cognition:  WNL  Sleep:  Number of Hours: 6.75      COGNITIVE FEATURES THAT CONTRIBUTE TO RISK:  None    SUICIDE RISK:   Mild:  Suicidal ideation of limited frequency, intensity, duration, and specificity.  There are no identifiable plans, no associated intent, mild dysphoria and related symptoms, good self-control (both objective and subjective assessment), few other risk factors, and identifiable protective factors, including available and accessible social support.  PLAN OF CARE: Patient is seen and examined.  Patient is a 23 year old female with the above-stated past psychiatric history who presented to the behavioral health hospital with suicidal ideation.  She will be admitted to the hospital.  She will be integrated into the milieu.  She will be encouraged to attend groups.  She had  previously taken Lexapro which led to suicidal ideation, and also previously taken fluoxetine which led to hypomanic/manic symptoms.  She does have a family history of bipolar disorder.  I am going to hold off restarting fluoxetine or any other antidepressant at this point.  We will start her on 2 mg of Abilify right now, and start her on 5 mg p.o. nightly.  We will see how she tolerates that.  Additionally she had been taking buspirone, but only on a as needed basis.  I am going to put her on 10 mg p.o. 3 times daily.  Her laboratories are essentially negative.  Her TSH is normal at 1.802.  No alcohol or drugs.  We will see how she does with this and proceed forward.  I certify that inpatient services furnished can reasonably be expected to improve the patient's condition.   Antonieta Pert, MD 04/08/2018, 10:33 AM

## 2018-04-08 NOTE — Progress Notes (Signed)
Patient has refused several times to take her ability this morning.  Patient was given printout on ability and vistaril.  Patient will talk to MD about her medications this morning.
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
## 2018-04-09 DIAGNOSIS — F322 Major depressive disorder, single episode, severe without psychotic features: Secondary | ICD-10-CM

## 2018-04-09 LAB — PROLACTIN: PROLACTIN: 106.9 ng/mL — AB (ref 4.8–23.3)

## 2018-04-09 MED ORDER — ARIPIPRAZOLE 5 MG PO TABS
5.0000 mg | ORAL_TABLET | Freq: Every day | ORAL | Status: DC
  Administered 2018-04-09: 5 mg via ORAL
  Filled 2018-04-09 (×4): qty 1

## 2018-04-09 NOTE — Progress Notes (Addendum)
Patient ID: Vanessa Bender, female   DOB: October 11, 1995, 23 y.o.   MRN: [id number]  Nursing Progress Note [id number]  On initial approach, patient is seen resting in bed. Patient prompted for morning medications but reports "buspar makes me drowsy" and requests to take later this morning. Patient did participate in breakfast this morning and does not appear to have eaten her breakfast tray. Patient presents lethargic but is arousable. Patient currently denies SI/HI/AVH. Patient denies concerns for writer at this time. Patient is seen up going to rec therapy with the group.  Patient is educated about and provided medication per provider's orders. Patient safety maintained with q15 min safety checks and low fall risk precautions. Emotional support given, 1:1 interaction, and active listening provided. Patient encouraged to attend meals, groups, and work on treatment plan and goals. Labs, vital signs and patient behavior monitored throughout shift.   Patient contracts for safety with staff. Patient remains safe on the unit at this time and agrees to come to staff with any issues/concerns. Will continue to support and monitor.   Patient's self-inventory sheet Rated Energy Level  Normal  Rated Sleep  Fair  Rated Appetite  Fair  Rated Anxiety (0-10)  2  Rated Hopelessness (0-10)  0  Rated Depression (0-10)  2  Daily Goal  "prepare to cope with stress that may possibly rise in the future"  Any Additional Comments:

## 2018-04-09 NOTE — Plan of Care (Signed)
  Problem: Education: Goal: Knowledge of [facility] Education information/materials will improve Outcome: Progressing   Problem: Safety: Goal: Periods of time without injury will increase Outcome: Progressing   

## 2018-04-09 NOTE — Tx Team (Signed)
Interdisciplinary Treatment and Diagnostic Plan Update  04/09/2018 Time of Session:  Vanessa Bender MRN: [id number]  Principal Diagnosis: <principal problem not specified>  Secondary Diagnoses: Active Problems:   MDD (major depressive disorder), severe (HCC)   Other bipolar disorder (HCC)   Generalized anxiety disorder   Posttraumatic stress disorder   Current Medications:  Current Facility-Administered Medications  Medication Dose Route Frequency Provider Last Rate Last Dose  . acetaminophen (TYLENOL) tablet 650 mg  650 mg Oral Q6H PRN Kerry Hough, PA-C      . alum & mag hydroxide-simeth (MAALOX/MYLANTA) 200-200-20 MG/5ML suspension 30 mL  30 mL Oral Q4H PRN Donell Sievert E, PA-C      . ARIPiprazole (ABILIFY) tablet 5 mg  5 mg Oral QHS Antonieta Pert, MD   5 mg at 04/08/18 2150  . busPIRone (BUSPAR) tablet 10 mg  10 mg Oral BID Antonieta Pert, MD   10 mg at 04/08/18 1631  . hydrOXYzine (ATARAX/VISTARIL) tablet 25 mg  25 mg Oral Q6H PRN Kerry Hough, PA-C   25 mg at 04/07/18 2113  . magnesium hydroxide (MILK OF MAGNESIA) suspension 30 mL  30 mL Oral Daily PRN Kerry Hough, PA-C      . traZODone (DESYREL) tablet 50 mg  50 mg Oral QHS,MR X 1 Kerry Hough, PA-C   50 mg at 04/08/18 2150   PTA Medications: Medications Prior to Admission  Medication Sig Dispense Refill Last Dose  . busPIRone (BUSPAR) 5 MG tablet Take 5 mg by mouth 3 (three) times daily as needed (For anxiety.).    04/06/2018  . FLUoxetine (PROZAC) 10 MG capsule Take 10 mg by mouth every evening.    04/07/2018 at 4pm    Patient Stressors: Marital or family conflict Traumatic event  Patient Strengths: Ability for insight Average or above average intelligence Communication skills General fund of knowledge Motivation for treatment/growth Physical Health Special hobby/interest Supportive family/friends Work skills  Treatment Modalities: Medication Management, Group therapy, Case management,  1  to 1 session with clinician, Psychoeducation, Recreational therapy.   Physician Treatment Plan for Primary Diagnosis: <principal problem not specified> Long Term Goal(s): Improvement in symptoms so as ready for discharge Improvement in symptoms so as ready for discharge   Short Term Goals: Ability to identify changes in lifestyle to reduce recurrence of condition will improve Ability to verbalize feelings will improve Ability to disclose and discuss suicidal ideas Ability to demonstrate self-control will improve Ability to identify and develop effective coping behaviors will improve Ability to maintain clinical measurements within normal limits will improve Ability to identify changes in lifestyle to reduce recurrence of condition will improve Ability to verbalize feelings will improve Ability to disclose and discuss suicidal ideas Ability to demonstrate self-control will improve Ability to identify and develop effective coping behaviors will improve Ability to maintain clinical measurements within normal limits will improve  Medication Management: Evaluate patient's response, side effects, and tolerance of medication regimen.  Therapeutic Interventions: 1 to 1 sessions, Unit Group sessions and Medication administration.  Evaluation of Outcomes: Progressing  Physician Treatment Plan for Secondary Diagnosis: Active Problems:   MDD (major depressive disorder), severe (HCC)   Other bipolar disorder (HCC)   Generalized anxiety disorder   Posttraumatic stress disorder  Long Term Goal(s): Improvement in symptoms so as ready for discharge Improvement in symptoms so as ready for discharge   Short Term Goals: Ability to identify changes in lifestyle to reduce recurrence of condition will improve Ability to verbalize feelings  will improve Ability to disclose and discuss suicidal ideas Ability to demonstrate self-control will improve Ability to identify and develop effective coping  behaviors will improve Ability to maintain clinical measurements within normal limits will improve Ability to identify changes in lifestyle to reduce recurrence of condition will improve Ability to verbalize feelings will improve Ability to disclose and discuss suicidal ideas Ability to demonstrate self-control will improve Ability to identify and develop effective coping behaviors will improve Ability to maintain clinical measurements within normal limits will improve     Medication Management: Evaluate patient's response, side effects, and tolerance of medication regimen.  Therapeutic Interventions: 1 to 1 sessions, Unit Group sessions and Medication administration.  Evaluation of Outcomes: Progressing   RN Treatment Plan for Primary Diagnosis: <principal problem not specified> Long Term Goal(s): Knowledge of disease and therapeutic regimen to maintain health will improve  Short Term Goals: Ability to participate in decision making will improve, Ability to verbalize feelings will improve, Ability to disclose and discuss suicidal ideas, Ability to identify and develop effective coping behaviors will improve and Compliance with prescribed medications will improve  Medication Management: RN will administer medications as ordered by provider, will assess and evaluate patient's response and provide education to patient for prescribed medication. RN will report any adverse and/or side effects to prescribing provider.  Therapeutic Interventions: 1 on 1 counseling sessions, Psychoeducation, Medication administration, Evaluate responses to treatment, Monitor vital signs and CBGs as ordered, Perform/monitor CIWA, COWS, AIMS and Fall Risk screenings as ordered, Perform wound care treatments as ordered.  Evaluation of Outcomes: Progressing   LCSW Treatment Plan for Primary Diagnosis: <principal problem not specified> Long Term Goal(s): Safe transition to appropriate next level of care at  discharge, Engage patient in therapeutic group addressing interpersonal concerns.  Short Term Goals: Engage patient in aftercare planning with referrals and resources  Therapeutic Interventions: Assess for all discharge needs, 1 to 1 time with Social worker, Explore available resources and support systems, Assess for adequacy in community support network, Educate family and significant other(s) on suicide prevention, Complete Psychosocial Assessment, Interpersonal group therapy.  Evaluation of Outcomes: Adequate for Discharge   Progress in Treatment: Attending groups: No. Participating in groups: No. Taking medication as prescribed: Yes. Toleration medication: Yes. Family/Significant other contact made: No, will contact:  patient declined consent for collateral contacts at this time Patient understands diagnosis: Yes. Discussing patient identified problems/goals with staff: Yes. Medical problems stabilized or resolved: Yes. Denies suicidal/homicidal ideation: Yes. Issues/concerns per patient self-inventory: No. Other:   New problem(s) identified: None   New Short Term/Long Term Goal(s):medication stabilization, elimination of SI thoughts, development of comprehensive mental wellness plan.    Patient Goals: Help with depression, anxiety and insomnia   Discharge Plan or Barriers: Patient plans to return home. She plans to follow up with Dr. Delight Ovens at [facility] for therapy services and [facility] for medication management services. CSW will continue to follow.   Reason for Continuation of Hospitalization: Anxiety Depression Medication stabilization Suicidal ideation  Estimated Length of Stay: 04/12/2018  Attendees: Patient: 04/09/2018 9:45 AM  Physician: Dr. Landry Mellow, MD 04/09/2018 9:45 AM  Nursing: Marchelle Folks.Salena Saner, RN 04/09/2018 9:45 AM  RN Care Manager: 04/09/2018 9:45 AM  Social Worker: Baldo Daub, LCSWA 04/09/2018 9:45 AM  Recreational Therapist:  04/09/2018  9:45 AM  Other:  04/09/2018 9:45 AM  Other:  04/09/2018 9:45 AM  Other: 04/09/2018 9:45 AM    Scribe for Treatment Team: Maeola Sarah, LCSWA 04/09/2018 9:45 AM

## 2018-04-09 NOTE — Progress Notes (Signed)
[facility] MD Progress Note  04/09/2018 1:32 PM Vanessa Bender  MRN:  [id number] Subjective:  "I'm doing better."  Vanessa Bender found socializing in the dayroom. She reports improved mood and decreased anxiety today. She had an episode of dizziness and nausea yesterday, so Abilify was discontinued. Today she reports she had spent most of yesterday sleeping and had not eaten well. She thinks the dizziness and nausea were related to not eating enough and wants to continue the Abilify. She has been out of the bed and more social today, with improved appetite. She reports feeling more hopeful about the future. She plans to do shorter visits at the hospital with her boyfriend, set boundaries with boyfriend's family, and sleep overnight at home. Denies SI/HI/AVH.  From admission H&P: Patient is a 23 year old female with a reported past psychiatric history significant for anxiety, depression, probable posttraumatic stress disorder who presented to the behavioral health hospital directly on 04/07/2018 with suicidal ideation. Patient stated that she had a history of depression anxiety since she was approximately 23 years old, but things have gotten worse very recently. She stated her boyfriend who has a history of substance issues, overdosed on some unspecified opiate which led to significant debility to the point where he is now in a pill to be unit in the hospital. She stated that prior to his overdose she had attempted to get him clean. He had moved in with her prior to this.   Principal Problem: MDD (major depressive disorder), severe (HCC) Diagnosis: Principal Problem:   MDD (major depressive disorder), severe (HCC) Active Problems:   Other bipolar disorder (HCC)   Generalized anxiety disorder   Posttraumatic stress disorder  Total Time spent with patient: 15 minutes  Past Psychiatric History: See admission H&P  Past Medical History:  Past Medical History:  Diagnosis Date  . Acid reflux disease    History  reviewed. No pertinent surgical history. Family History:  Family History  Problem Relation Age of Onset  . Thyroid disease Mother   . Diabetes Father   . Stroke Paternal Grandmother   . Diabetes Paternal Grandmother    Family Psychiatric  History: See admission H&P Social History:  Social History   Substance and Sexual Activity  Alcohol Use Not Currently     Social History   Substance and Sexual Activity  Drug Use Never    Social History   Socioeconomic History  . Marital status: Single    Spouse name: Not on file  . Number of children: Not on file  . Years of education: Not on file  . Highest education level: Not on file  Occupational History  . Not on file  Social Needs  . Financial resource strain: Not on file  . Food insecurity:    Worry: Not on file    Inability: Not on file  . Transportation needs:    Medical: Not on file    Non-medical: Not on file  Tobacco Use  . Smoking status: Never Smoker  . Smokeless tobacco: Never Used  Substance and Sexual Activity  . Alcohol use: Not Currently  . Drug use: Never  . Sexual activity: Yes  Lifestyle  . Physical activity:    Days per week: Not on file    Minutes per session: Not on file  . Stress: Not on file  Relationships  . Social connections:    Talks on phone: Not on file    Gets together: Not on file    Attends religious service: Not on  file    Active member of club or organization: Not on file    Attends meetings of clubs or organizations: Not on file    Relationship status: Not on file  Other Topics Concern  . Not on file  Social History Narrative  . Not on file   Additional Social History:    Pain Medications: None Prescriptions: Fluoxetine 10mg  once daily; Buspirone 5mg  up to 3x per day Over the Counter: None History of alcohol / drug use?: No history of alcohol / drug abuse                    Sleep: Fair  Appetite:  Good  Current Medications: Current Facility-Administered  Medications  Medication Dose Route Frequency Provider Last Rate Last Dose  . acetaminophen (TYLENOL) tablet 650 mg  650 mg Oral Q6H PRN Kerry Hough, PA-C      . alum & mag hydroxide-simeth (MAALOX/MYLANTA) 200-200-20 MG/5ML suspension 30 mL  30 mL Oral Q4H PRN Donell Sievert E, PA-C      . ARIPiprazole (ABILIFY) tablet 5 mg  5 mg Oral QHS Aldean Baker, NP      . busPIRone (BUSPAR) tablet 10 mg  10 mg Oral BID Antonieta Pert, MD   10 mg at 04/09/18 1059  . hydrOXYzine (ATARAX/VISTARIL) tablet 25 mg  25 mg Oral Q6H PRN Kerry Hough, PA-C   25 mg at 04/07/18 2113  . magnesium hydroxide (MILK OF MAGNESIA) suspension 30 mL  30 mL Oral Daily PRN Kerry Hough, PA-C      . traZODone (DESYREL) tablet 50 mg  50 mg Oral QHS,MR X 1 Kerry Hough, PA-C   50 mg at 04/08/18 2150    Lab Results:  Results for orders placed or performed during the hospital encounter of 04/07/18 (from the past 48 hour(s))  Urinalysis, Routine w reflex microscopic     Status: Abnormal   Collection Time: 04/08/18  5:00 AM  Result Value Ref Range   Color, Urine YELLOW YELLOW   APPearance HAZY (A) CLEAR   Specific Gravity, Urine 1.005 1.005 - 1.030   pH 7.0 5.0 - 8.0   Glucose, UA NEGATIVE NEGATIVE mg/dL   Hgb urine dipstick NEGATIVE NEGATIVE   Bilirubin Urine NEGATIVE NEGATIVE   Ketones, ur NEGATIVE NEGATIVE mg/dL   Protein, ur NEGATIVE NEGATIVE mg/dL   Nitrite NEGATIVE NEGATIVE   Leukocytes,Ua NEGATIVE NEGATIVE    Comment: Performed at [facility], [address], [place], [place] [address]  Pregnancy, urine     Status: None   Collection Time: 04/08/18  5:00 AM  Result Value Ref Range   Preg Test, Ur NEGATIVE NEGATIVE    Comment:        THE SENSITIVITY OF THIS METHODOLOGY IS >20 mIU/mL. Performed at [facility], [address] [place], [place] [address]   Urine rapid drug screen (hosp performed)not at [facility]     Status: None   Collection Time: 04/08/18   5:00 AM  Result Value Ref Range   Opiates NONE DETECTED NONE DETECTED   Cocaine NONE DETECTED NONE DETECTED   Benzodiazepines NONE DETECTED NONE DETECTED   Amphetamines NONE DETECTED NONE DETECTED   Tetrahydrocannabinol NONE DETECTED NONE DETECTED   Barbiturates NONE DETECTED NONE DETECTED    Comment: (NOTE) DRUG SCREEN FOR MEDICAL PURPOSES ONLY.  IF CONFIRMATION IS NEEDED FOR ANY PURPOSE, NOTIFY LAB WITHIN 5 DAYS. LOWEST DETECTABLE LIMITS FOR URINE DRUG SCREEN Drug Class  Cutoff (ng/mL) Amphetamine and metabolites    1000 Barbiturate and metabolites    200 Benzodiazepine                 200 Tricyclics and metabolites     300 Opiates and metabolites        300 Cocaine and metabolites        300 THC                            50 Performed at [facility], [address], [place], [place] [address]   CBC     Status: None   Collection Time: 04/08/18  6:39 AM  Result Value Ref Range   WBC 7.0 4.0 - 10.5 K/uL   RBC 4.91 3.87 - 5.11 MIL/uL   Hemoglobin 13.6 12.0 - 15.0 g/dL   HCT 81.8 56.3 - 14.9 %   MCV 86.8 80.0 - 100.0 fL   MCH 27.7 26.0 - 34.0 pg   MCHC 31.9 30.0 - 36.0 g/dL   RDW 70.2 63.7 - 85.8 %   Platelets 292 150 - 400 K/uL   nRBC 0.0 0.0 - 0.2 %    Comment: Performed at [facility], [address] [place], [place] [address]  TSH     Status: None   Collection Time: 04/08/18  6:39 AM  Result Value Ref Range   TSH 1.802 0.350 - 4.500 uIU/mL    Comment: Performed by a 3rd Generation assay with a functional sensitivity of <=0.01 uIU/mL. Performed at [facility], [address] [place], [place] [address]   Prolactin     Status: Abnormal   Collection Time: 04/08/18  6:39 AM  Result Value Ref Range   Prolactin 106.9 (H) 4.8 - 23.3 ng/mL    Comment: (NOTE) Performed At: [facility] [address] [place], [place] [id number] Jolene Schimke MD OB:[id number]   Lipid panel      Status: Abnormal   Collection Time: 04/08/18  6:39 AM  Result Value Ref Range   Cholesterol 203 (H) 0 - 200 mg/dL   Triglycerides 52 <476 mg/dL   HDL 53 >54 mg/dL   Total CHOL/HDL Ratio 3.8 RATIO   VLDL 10 0 - 40 mg/dL   LDL Cholesterol 650 (H) 0 - 99 mg/dL    Comment:        Total Cholesterol/HDL:CHD Risk Coronary Heart Disease Risk Table                     Men   Women  1/2 Average Risk   3.4   3.3  Average Risk       5.0   4.4  2 X Average Risk   9.6   7.1  3 X Average Risk  23.4   11.0        Use the calculated Patient Ratio above and the CHD Risk Table to determine the patient's CHD Risk.        ATP III CLASSIFICATION (LDL):  <100     mg/dL   Optimal  354-656  mg/dL   Near or Above                    Optimal  130-159  mg/dL   Borderline  812-751  mg/dL   High  >700     mg/dL   Very High Performed at [facility], [address] [place], [place] [address]   Comprehensive metabolic panel     Status: None   Collection Time: 04/08/18  6:39 AM  Result Value Ref Range   Sodium 137 135 - 145 mmol/L   Potassium 3.9 3.5 - 5.1 mmol/L   Chloride 104 98 - 111 mmol/L   CO2 26 22 - 32 mmol/L   Glucose, Bld 82 70 - 99 mg/dL   BUN 6 6 - 20 mg/dL   Creatinine, Ser 6.04 0.44 - 1.00 mg/dL   Calcium 9.2 8.9 - 54.0 mg/dL   Total Protein 8.0 6.5 - 8.1 g/dL   Albumin 4.2 3.5 - 5.0 g/dL   AST 18 15 - 41 U/L   ALT 14 0 - 44 U/L   Alkaline Phosphatase 116 38 - 126 U/L   Total Bilirubin 0.8 0.3 - 1.2 mg/dL   GFR calc non Af Amer >60 >60 mL/min   GFR calc Af Amer >60 >60 mL/min   Anion gap 7 5 - 15    Comment: Performed at [facility], [address], [place], [place] [address]    Blood Alcohol level:  No results found for: [facility]  Metabolic Disorder Labs: No results found for: HGBA1C, MPG Lab Results  Component Value Date   PROLACTIN 106.9 (H) 04/08/2018   Lab Results  Component Value Date   CHOL 203 (H) 04/08/2018   TRIG 52 04/08/2018    HDL 53 04/08/2018   CHOLHDL 3.8 04/08/2018   VLDL 10 04/08/2018   LDLCALC 140 (H) 04/08/2018    Physical Findings: AIMS: Facial and Oral Movements Muscles of Facial Expression: None, normal Lips and Perioral Area: None, normal Jaw: None, normal Tongue: None, normal,Extremity Movements Upper (arms, wrists, hands, fingers): None, normal Lower (legs, knees, ankles, toes): None, normal, Trunk Movements Neck, shoulders, hips: None, normal, Overall Severity Severity of abnormal movements (highest score from questions above): None, normal Incapacitation due to abnormal movements: None, normal Patient's awareness of abnormal movements (rate only patient's report): No Awareness, Dental Status Current problems with teeth and/or dentures?: No Does patient usually wear dentures?: No  CIWA:    COWS:     Musculoskeletal: Strength & Muscle Tone: within normal limits Gait & Station: normal Patient leans: N/A  Psychiatric Specialty Exam: Physical Exam  Nursing note and vitals reviewed. Constitutional: She is oriented to person, place, and time. She appears well-developed and well-nourished.  Cardiovascular: Normal rate.  Respiratory: Effort normal.  Neurological: She is alert and oriented to person, place, and time.    Review of Systems  Constitutional: Negative.   Psychiatric/Behavioral: Positive for depression (improving). Negative for hallucinations, memory loss, substance abuse and suicidal ideas. The patient is not nervous/anxious and does not have insomnia.     Blood pressure 120/70, pulse 98, temperature 98.5 F (36.9 C), temperature source Oral, resp. rate 20, height  (1.651 m), weight 93 kg.Body mass index is 34.11 kg/m.  General Appearance: Casual  Eye Contact:  Fair  Speech:  Normal Rate  Volume:  Normal  Mood:  Euthymic  Affect:  Appropriate and Congruent  Thought Process:  Coherent  Orientation:  Full (Time, Place, and Person)  Thought Content:  WDL  Suicidal  Thoughts:  No  Homicidal Thoughts:  No  Memory:  Immediate;   Good Recent;   Good  Judgement:  Intact  Insight:  Fair  Psychomotor Activity:  Normal  Concentration:  Concentration: Good  Recall:  Good  Fund of Knowledge:  Good  Language:  Good  Akathisia:  No  Handed:  Right  AIMS (if indicated):     Assets:  Communication Skills Desire for Improvement Housing Social Support  ADL's:  Intact  Cognition:  WNL  Sleep:  Number of Hours: 6.25     Treatment Plan Summary: Daily contact with patient to assess and evaluate symptoms and progress in treatment and Medication management   Continue inpatient hospitalization.  Resume Abilify 5 mg PO QHS for mood Continue Buspar 10 mg PO BID for anxiety Continue Vistaril 25 mg PO Q6HR PRN anxiety Continue trazodone 50 mg PO QHS PRN insomnia  Patient will participate in the therapeutic group milieu.  Discharge disposition in progress.   Aldean Baker, NP 04/09/2018, 1:32 PM

## 2018-04-09 NOTE — Progress Notes (Signed)
Recreation Therapy Notes  Date:  3.27.20 Time: 0930 Location: [address] Dayroom  Group Topic: Stress Management  Goal Area(s) Addresses:  Patient will identify positive stress management techniques. Patient will identify benefits of using stress management post d/c.  Intervention:  Stress Management  Activity :  Guided Imagery.  LRT introduced the stress management technique of guided imagery.  LRT read a script that lead patients on a journey on the beach at sunset.  Patients were to listen and follow along as script was read to engage in activity.  Education:  Stress Management, Discharge Planning.   Education Outcome: Acknowledges Education  Clinical Observations/Feedback:  Pt did not attend group.     Danamarie Minami, LRT/CTRS         Brogen Duell A 04/09/2018 11:01 AM 

## 2018-04-10 MED ORDER — HYDROXYZINE HCL 25 MG PO TABS: 25.0000 mg | ORAL_TABLET | Freq: Four times a day (QID) | ORAL | 0 refills | Status: AC | PRN

## 2018-04-10 MED ORDER — BUSPIRONE HCL 10 MG PO TABS: 10.0000 mg | ORAL_TABLET | Freq: Two times a day (BID) | ORAL | 0 refills | Status: AC

## 2018-04-10 MED ORDER — TRAZODONE HCL 50 MG PO TABS: 50.0000 mg | ORAL_TABLET | Freq: Every evening | ORAL | 0 refills | Status: AC | PRN

## 2018-04-10 MED ORDER — ARIPIPRAZOLE 5 MG PO TABS: 5.0000 mg | ORAL_TABLET | Freq: Every day | ORAL | 0 refills | Status: AC

## 2018-04-10 NOTE — [facility] Suicide Risk Assessment (Signed)
[facility] Discharge Suicide Risk Assessment   Principal Problem: MDD (major depressive disorder), severe (HCC) Discharge Diagnoses: Principal Problem:   MDD (major depressive disorder), severe (HCC) Active Problems:   Other bipolar disorder (HCC)   Generalized anxiety disorder   Posttraumatic stress disorder   Total Time spent with patient: 15 minutes  Musculoskeletal: Strength & Muscle Tone: within normal limits Gait & Station: normal Patient leans: N/A  Psychiatric Specialty Exam: ROS  Blood pressure 110/81, pulse (!) 123, temperature 98.2 F (36.8 C), temperature source Oral, resp. rate 16, height 5\' 5"  (1.651 m), weight 93 kg.Body mass index is 34.11 kg/m.  General Appearance: Casual  Eye Contact::  Fair  Speech:  Normal Rate409  Volume:  Normal  Mood:  Anxious  Affect:  Congruent  Thought Process:  Coherent and Descriptions of Associations: Intact  Orientation:  Full (Time, Place, and Person)  Thought Content:  Logical  Suicidal Thoughts:  No  Homicidal Thoughts:  No  Memory:  Immediate;   Fair Recent;   Fair Remote;   Fair  Judgement:  Intact  Insight:  Fair  Psychomotor Activity:  Normal  Concentration:  Good  Recall:  Good  Fund of Knowledge:Good  Language: Good  Akathisia:  Negative  Handed:  Right  AIMS (if indicated):     Assets:  Desire for Improvement Financial Resources/Insurance Housing Physical Health Resilience  Sleep:  Number of Hours: 6.75  Cognition: WNL  ADL's:  Intact   Mental Status Per Nursing Assessment::   On Admission:  NA  Demographic Factors:  Adolescent or young adult  Loss Factors: Loss of significant relationship  Historical Factors: Impulsivity  Risk Reduction Factors:   Sense of responsibility to family, Employed and Positive coping skills or problem solving skills  Continued Clinical Symptoms:  Depression:   Impulsivity  Cognitive Features That Contribute To Risk:  None    Suicide Risk:  Minimal: No  identifiable suicidal ideation.  Patients presenting with no risk factors but with morbid ruminations; may be classified as minimal risk based on the severity of the depressive symptoms  Follow-up Information    Delight Ovens, LCSW Follow up on 04/16/2018.   Specialty:  Licensed Clinical Social Worker Why:  Therapy appointment is Friday, 4/3 at 3:30p.  Your appointment will be conducted over WebEx, you will receive a link 10 minutes before your appointment with instructions.  Contact information: [address] [place] [address] [phone]        [facility] Follow up on 04/12/2018.   Why:  Medication management appointment with Elon Jester is Monday, 3/30 at 2:30p.  Contact information: [address] [place] [address] [phone]           Plan Of Care/Follow-up recommendations:  Activity:  ad lib  Antonieta Pert, MD 04/10/2018, 7:39 AM

## 2018-04-10 NOTE — Progress Notes (Signed)
Pt discharged home on her car. Pt was ambulatory, stable and appreciative at that time. All papers and prescriptions were given and valuables returned. Verbal understanding expressed. Denies SI/HI and A/VH. Pt given opportunity to express concerns and ask questions.

## 2018-04-10 NOTE — Progress Notes (Signed)
Pt in dayroom interacting.  Pt has improved mood.  Pt is smiling and participates in assessment.  Pt sts she was told she would d/c in am.  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  Pt is med compliant.  Pt denies pain or discomfort.  Pt indorses guilt over BF O.D. in her apartment and is currently in [facility] on palliative care. BF family blames her for O.D.  Pt denies SI, HI and AVH.  Pt verbally contracts for safety Pt offered support and encouragement.  Pt takes evening meds and goes to bed. Pt remains safe on unit

## 2018-04-10 NOTE — [facility] Group Notes (Signed)
[facility] Group Notes: (Clinical Social Work)   04/10/2018      Type of Therapy:  Group Therapy   Participation Level:  Did Not Attend - was invited both individually by MHT and by overhead announcement - was in process of discharging   Ambrose Mantle, LCSW 04/10/2018, 1:19 PM

## 2018-04-10 NOTE — Discharge Summary (Signed)
Physician Discharge Summary Note  Patient:  Vanessa Bender is an 23 y.o., female MRN:  [id number] DOB:  Nov 04, 1995 Patient phone:  [phone] (home)  Patient address:   [address] [place] [address],  Total Time spent with patient: 15 minutes  Date of Admission:  04/07/2018 Date of Discharge: 04/10/2018  Reason for Admission:  Depression  Per assessment note-Vanessa Bender is an 23 y.o. female. -Patient came to [facility] assessment services by herself.Patient is tearful during assessment.  She said she has dealt with depression and anxiety since she was 23 years old.  Over the last two years the depression has been getting worse.  Patient says that the last couple months have been very intense with increased anxiety attacks.Patient grew tearful in relating that on February 22 of this year her boyfriend overdosed in her apartment.  She found him unresponsive.  As a result of the overdose he has been in palliative care and is on the 6th floor at [facility]  Patient says she had tried for months to help him stay away from drugs.Patient said that as recently as yesterday she had thoughts of jumping from a parking deck to kill herself.  Today she had thoughts of overdosing on medications.  She denies any previous suicide attempts.  Principal Problem: MDD (major depressive disorder), severe (HCC) Discharge Diagnoses: Principal Problem:   MDD (major depressive disorder), severe (HCC) Active Problems:   Other bipolar disorder (HCC)   Generalized anxiety disorder   Posttraumatic stress disorder   Past Psychiatric History:   Past Medical History:  Past Medical History:  Diagnosis Date  . Acid reflux disease    History reviewed. No pertinent surgical history. Family History:  Family History  Problem Relation Age of Onset  . Thyroid disease Mother   . Diabetes Father   . Stroke Paternal Grandmother   . Diabetes Paternal Grandmother    Family Psychiatric  History:  Social  History:  Social History   Substance and Sexual Activity  Alcohol Use Not Currently     Social History   Substance and Sexual Activity  Drug Use Never    Social History   Socioeconomic History  . Marital status: Single    Spouse name: Not on file  . Number of children: Not on file  . Years of education: Not on file  . Highest education level: Not on file  Occupational History  . Not on file  Social Needs  . Financial resource strain: Not on file  . Food insecurity:    Worry: Not on file    Inability: Not on file  . Transportation needs:    Medical: Not on file    Non-medical: Not on file  Tobacco Use  . Smoking status: Never Smoker  . Smokeless tobacco: Never Used  Substance and Sexual Activity  . Alcohol use: Not Currently  . Drug use: Never  . Sexual activity: Yes  Lifestyle  . Physical activity:    Days per week: Not on file    Minutes per session: Not on file  . Stress: Not on file  Relationships  . Social connections:    Talks on phone: Not on file    Gets together: Not on file    Attends religious service: Not on file    Active member of club or organization: Not on file    Attends meetings of clubs or organizations: Not on file    Relationship status: Not on file  Other Topics Concern  .  Not on file  Social History Narrative  . Not on file    Hospital Course:  Vanessa Bender was admitted for MDD (major depressive disorder), severe (HCC)  and crisis management.  Pt was treated discharged with the medications listed below under Medication List.  Medical problems were identified and treated as needed.  Home medications were restarted as appropriate.  Improvement was monitored by observation and Lauralee Evener 's daily report of symptom reduction.  Emotional and mental status was monitored by daily self-inventory reports completed by Lauralee Evener and clinical staff.         Lauralee Evener was evaluated by the treatment team for stability and plans for continued  recovery upon discharge. Lauralee Evener 's motivation was an integral factor for scheduling further treatment. Employment, transportation, bed availability, health status, family support, and any pending legal issues were also considered during hospital stay. Pt was offered further treatment options upon discharge including but not limited to Residential, Intensive Outpatient, and Outpatient treatment.  Lauralee Evener will follow up with the services as listed below under Follow Up Information.     Upon completion of this admission the patient was both mentally and medically stable for discharge denying suicidal/homicidal ideations.  [facility] responded well to treatment with Abilify 5 mg and Buspar 10 mg and Trazodone 50 mg without adverse effects.  Pt demonstrated improvement without reported or observed adverse effects to the point of stability appropriate for outpatient management. Pertinent labs include: Lipids, prolactin and UDS, for which outpatient follow-up is necessary for lab recheck as mentioned below. Reviewed CBC, CMP, BAL, and UDS; all unremarkable aside from noted exceptions.   Physical Findings: AIMS: Facial and Oral Movements Muscles of Facial Expression: None, normal Lips and Perioral Area: None, normal Jaw: None, normal Tongue: None, normal,Extremity Movements Upper (arms, wrists, hands, fingers): None, normal Lower (legs, knees, ankles, toes): None, normal, Trunk Movements Neck, shoulders, hips: None, normal, Overall Severity Severity of abnormal movements (highest score from questions above): None, normal Incapacitation due to abnormal movements: None, normal Patient's awareness of abnormal movements (rate only patient's report): No Awareness, Dental Status Current problems with teeth and/or dentures?: No Does patient usually wear dentures?: No  CIWA:    COWS:     Musculoskeletal: Strength & Muscle Tone: within normal limits Gait & Station: normal Patient leans:  N/A  Psychiatric Specialty Exam: See SRA by MD Physical Exam  Vitals reviewed. Constitutional: She appears well-developed.  Psychiatric: She has a normal mood and affect. Her behavior is normal.    Review of Systems  Psychiatric/Behavioral: Positive for depression (stable). Negative for suicidal ideas. The patient is not nervous/anxious.   All other systems reviewed and are negative.   Blood pressure 110/81, pulse (!) 123, temperature 98.2 F (36.8 C), temperature source Oral, resp. rate 16, height  (1.651 m), weight 93 kg.Body mass index is 34.11 kg/m.    Have you used any form of tobacco in the last 30 days? (Cigarettes, Smokeless Tobacco, Cigars, and/or Pipes): No  Has this patient used any form of tobacco in the last 30 days? (Cigarettes, Smokeless Tobacco, Cigars, and/or Pipes) No  Blood Alcohol level:  No results found for: [facility]  Metabolic Disorder Labs:  No results found for: HGBA1C, MPG Lab Results  Component Value Date   PROLACTIN 106.9 (H) 04/08/2018   Lab Results  Component Value Date   CHOL 203 (H) 04/08/2018   TRIG 52 04/08/2018   HDL 53 04/08/2018   CHOLHDL 3.8 04/08/2018  VLDL 10 04/08/2018   LDLCALC 140 (H) 04/08/2018    See Psychiatric Specialty Exam and Suicide Risk Assessment completed by Attending Physician prior to discharge.  Discharge destination:  Home  Is patient on multiple antipsychotic therapies at discharge:  No   Has Patient had three or more failed trials of antipsychotic monotherapy by history:  No  Recommended Plan for Multiple Antipsychotic Therapies: NA  Discharge Instructions    Diet - low sodium heart healthy   Complete by:  As directed    Discharge instructions   Complete by:  As directed    Take all medications as prescribed. Keep all follow-up appointments as scheduled.  Do not consume alcohol or use illegal drugs while on prescription medications. Report any adverse effects from your medications to your primary  care provider promptly.  In the event of recurrent symptoms or worsening symptoms, call 911, a crisis hotline, or go to the nearest emergency department for evaluation.   Increase activity slowly   Complete by:  As directed      Allergies as of 04/10/2018   No Known Allergies     Medication List    TAKE these medications     Indication  ARIPiprazole 5 MG tablet Commonly known as:  ABILIFY Take 1 tablet (5 mg total) by mouth at bedtime.  Indication:  Major Depressive Disorder   busPIRone 10 MG tablet Commonly known as:  BUSPAR Take 1 tablet (10 mg total) by mouth 2 (two) times daily. What changed:    medication strength  how much to take  when to take this  reasons to take this  Indication:  Anxiety Disorder, Major Depressive Disorder   FLUoxetine 10 MG capsule Commonly known as:  PROZAC Take 10 mg by mouth every evening.  Indication:  Depression   hydrOXYzine 25 MG tablet Commonly known as:  ATARAX/VISTARIL Take 1 tablet (25 mg total) by mouth every 6 (six) hours as needed for anxiety.  Indication:  Feeling Anxious   traZODone 50 MG tablet Commonly known as:  DESYREL Take 1 tablet (50 mg total) by mouth at bedtime and may repeat dose one time if needed.  Indication:  Trouble Sleeping      Follow-up Information    Delight Ovens, LCSW Follow up on 04/16/2018.   Specialty:  Licensed Clinical Social Worker Why:  Therapy appointment is Friday, 4/3 at 3:30p.  Your appointment will be conducted over WebEx, you will receive a link 10 minutes before your appointment with instructions.  Contact information: [address] [place] [address] [phone]        [facility] Follow up on 04/12/2018.   Why:  Medication management appointment with Elon Jester is Monday, 3/30 at 2:30p.  Contact information: [address] [place] [address] [phone]           Follow-up recommendations:  Activity:  as tolorated Diet:  heart  healthy  Comments:  Take all medications as prescribed. Keep all follow-up appointments as scheduled.  Do not consume alcohol or use illegal drugs while on prescription medications. Report any adverse effects from your medications to your primary care provider promptly.  In the event of recurrent symptoms or worsening symptoms, call 911, a crisis hotline, or go to the nearest emergency department for evaluation.   Signed: Oneta Rack, NP 04/10/2018, 8:19 AM

## 2018-04-10 NOTE — Progress Notes (Signed)
  [facility] Adult Case Management Discharge Plan :  Will you be returning to the same living situation after discharge:  Yes,  home alone At discharge, do you have transportation home?: Yes,  arranged by patient Do you have the ability to pay for your medications: Yes,  patient denies barriers  Release of information consent forms completed and turned in to Medical Records by CSW.   Patient to Follow up at: Follow-up Information    Delight Ovens, LCSW Follow up on 04/16/2018.   Specialty:  Licensed Clinical Social Worker Why:  Therapy appointment is Friday, 4/3 at 3:30p.  Your appointment will be conducted over WebEx, you will receive a link 10 minutes before your appointment with instructions.  Contact information: [address] [place] [address] [phone]        [facility] Follow up on 04/12/2018.   Why:  Medication management appointment with Elon Jester is Monday, 3/30 at 2:30p.  Contact information: [address] [place] [address] [phone]            Next level of care provider has access to [facility] Link:yes   Safety Planning and Suicide Prevention discussed: No. Patient refused for family or friends to be contacted, so this information was shared with her and a brochure was given to her to share with people in her life  Have you used any form of tobacco in the last 30 days? (Cigarettes, Smokeless Tobacco, Cigars, and/or Pipes): No  Has patient been referred to the Quitline?: N/A patient is not a smoker  Patient has been referred for addiction treatment: N/A  Lynnell Chad, LCSW 04/10/2018, 8:50 AM

## 2018-04-16 ENCOUNTER — Ambulatory Visit (INDEPENDENT_AMBULATORY_CARE_PROVIDER_SITE_OTHER): Payer: 59 | Admitting: Psychology

## 2018-04-16 DIAGNOSIS — F3189 Other bipolar disorder: Secondary | ICD-10-CM

## 2018-04-19 ENCOUNTER — Ambulatory Visit: Payer: 59 | Admitting: Psychology

## 2018-05-05 ENCOUNTER — Ambulatory Visit (INDEPENDENT_AMBULATORY_CARE_PROVIDER_SITE_OTHER): Payer: 59 | Admitting: Psychology

## 2018-05-05 DIAGNOSIS — F3189 Other bipolar disorder: Secondary | ICD-10-CM | POA: Diagnosis not present

## 2018-05-19 ENCOUNTER — Ambulatory Visit (INDEPENDENT_AMBULATORY_CARE_PROVIDER_SITE_OTHER): Payer: 59 | Admitting: Psychology

## 2018-05-19 DIAGNOSIS — F3189 Other bipolar disorder: Secondary | ICD-10-CM | POA: Diagnosis not present

## 2018-06-02 ENCOUNTER — Ambulatory Visit: Payer: 59 | Admitting: Psychology

## 2018-11-03 ENCOUNTER — Ambulatory Visit (INDEPENDENT_AMBULATORY_CARE_PROVIDER_SITE_OTHER): Payer: 59 | Admitting: Psychology

## 2018-11-03 DIAGNOSIS — F3189 Other bipolar disorder: Secondary | ICD-10-CM | POA: Diagnosis not present

## 2019-10-19 ENCOUNTER — Other Ambulatory Visit: Payer: Self-pay

## 2019-10-20 ENCOUNTER — Ambulatory Visit: Payer: 59 | Admitting: Nurse Practitioner

## 2019-10-25 ENCOUNTER — Encounter: Payer: Self-pay | Admitting: Nurse Practitioner

## 2019-10-25 ENCOUNTER — Ambulatory Visit (INDEPENDENT_AMBULATORY_CARE_PROVIDER_SITE_OTHER): Payer: 59 | Admitting: Nurse Practitioner

## 2019-10-25 VITALS — BP 110/80 | HR 60 | Temp 96.7°F | Ht 66.0 in | Wt 203.0 lb

## 2019-10-25 DIAGNOSIS — Z136 Encounter for screening for cardiovascular disorders: Secondary | ICD-10-CM | POA: Diagnosis not present

## 2019-10-25 DIAGNOSIS — Z0000 Encounter for general adult medical examination without abnormal findings: Secondary | ICD-10-CM

## 2019-10-25 DIAGNOSIS — L639 Alopecia areata, unspecified: Secondary | ICD-10-CM

## 2019-10-25 DIAGNOSIS — E559 Vitamin D deficiency, unspecified: Secondary | ICD-10-CM | POA: Diagnosis not present

## 2019-10-25 DIAGNOSIS — Z13220 Encounter for screening for lipoid disorders: Secondary | ICD-10-CM | POA: Diagnosis not present

## 2019-10-25 LAB — COMPREHENSIVE METABOLIC PANEL
ALT: 13 U/L (ref 0–35)
AST: 17 U/L (ref 0–37)
Albumin: 4.6 g/dL (ref 3.5–5.2)
Alkaline Phosphatase: 95 U/L (ref 39–117)
BUN: 7 mg/dL (ref 6–23)
CO2: 27 mEq/L (ref 19–32)
Calcium: 9.6 mg/dL (ref 8.4–10.5)
Chloride: 103 mEq/L (ref 96–112)
Creatinine, Ser: 0.69 mg/dL (ref 0.40–1.20)
GFR: 120.82 mL/min (ref >60.00)
Glucose, Bld: 81 mg/dL (ref 70–99)
Potassium: 4 mEq/L (ref 3.5–5.1)
Sodium: 139 mEq/L (ref 135–145)
Total Bilirubin: 0.4 mg/dL (ref 0.2–1.2)
Total Protein: 7.7 g/dL (ref 6.0–8.3)

## 2019-10-25 LAB — LIPID PANEL
Cholesterol: 196 mg/dL (ref 0–200)
HDL: 54.9 mg/dL (ref >39.00)
LDL Cholesterol: 129 mg/dL — ABNORMAL HIGH (ref 0–99)
NonHDL: 141.3
Total CHOL/HDL Ratio: 4
Triglycerides: 61 mg/dL (ref 0.0–149.0)
VLDL: 12.2 mg/dL (ref 0.0–40.0)

## 2019-10-25 LAB — CBC WITH DIFFERENTIAL/PLATELET
Basophils Absolute: 0 10*3/uL (ref 0.0–0.1)
Basophils Relative: 0.7 % (ref 0.0–3.0)
Eosinophils Absolute: 0 10*3/uL (ref 0.0–0.7)
Eosinophils Relative: 0.9 % (ref 0.0–5.0)
HCT: 39.7 % (ref 36.0–46.0)
Hemoglobin: 13.2 g/dL (ref 12.0–15.0)
Lymphocytes Relative: 41.2 % (ref 12.0–46.0)
Lymphs Abs: 2.2 10*3/uL (ref 0.7–4.0)
MCHC: 33.2 g/dL (ref 30.0–36.0)
MCV: 84.9 fl (ref 78.0–100.0)
Monocytes Absolute: 0.4 10*3/uL (ref 0.1–1.0)
Monocytes Relative: 6.5 % (ref 3.0–12.0)
Neutro Abs: 2.7 10*3/uL (ref 1.4–7.7)
Neutrophils Relative %: 50.7 % (ref 43.0–77.0)
Platelets: 323 10*3/uL (ref 150.0–400.0)
RBC: 4.68 Mil/uL (ref 3.87–5.11)
RDW: 12.3 % (ref 11.5–15.5)
WBC: 5.4 10*3/uL (ref 4.0–10.5)

## 2019-10-25 LAB — FERRITIN: Ferritin: 35.5 ng/mL (ref 10.0–291.0)

## 2019-10-25 LAB — SEDIMENTATION RATE: Sed Rate: 15 mm/hr (ref 0–20)

## 2019-10-25 LAB — TSH: TSH: 1.39 u[IU]/mL (ref 0.35–4.50)

## 2019-10-25 LAB — C-REACTIVE PROTEIN: CRP: <1 mg/dL (ref 0.5–20.0)

## 2019-10-25 NOTE — Progress Notes (Signed)
Subjective:    Patient ID: Vanessa Bender, female    DOB: 1995/05/24, 24 y.o.   MRN: [id number]  Patient presents today for CPE and eval of hair loss  HPI Hair loss: Onset 27month ago, patches, get dominican hair press every 2-3weeks, denies use of any relaxants. Has upcoming appt with dermatology next week  Sexual History (orientation,birth control, marital status, STD):single, up to date with PAP and breast exam, no need for STD screen. Completed by GYN with [facility]  Depression/Suicide: managed by psychiatry Depression screen [facility] 2/9 10/25/2019  Decreased Interest 1  Down, Depressed, Hopeless 0  PHQ - 2 Score 1  Altered sleeping 1  Tired, decreased energy 0  Change in appetite 2  Feeling bad or failure about yourself  0  Trouble concentrating 1  Moving slowly or fidgety/restless 2  Suicidal thoughts 0  PHQ-9 Score 7  Difficult doing work/chores Not difficult at all   Vision:up to date  Dental:up to date  Immunizations: (TDAP, Hep C screen, Pneumovax, Influenza, zoster)  Health Maintenance  Topic Date Due   COVID-19 Vaccine (1) Never done   Tetanus Vaccine  Never done   Flu Shot  Never done   Pap Smear  08/11/2022   Pap Smear  08/11/2022    Hepatitis C: One time screening is recommended by Center for Disease Control  (CDC) for  adults born from [birth year] through 1966.   Completed   HIV Screening  Completed   Diet:regular.  Weight:  Wt Readings from Last 3 Encounters:  10/25/19 203 lb (92.1 kg)    Medications and allergies reviewed with patient and updated if appropriate.  Patient Active Problem List   Diagnosis Date Noted   Other bipolar disorder (HCC)    Generalized anxiety disorder    Posttraumatic stress disorder    MDD (major depressive disorder), severe (HCC) 04/07/2018   Goiter 01/18/2013    Current Outpatient Medications on File Prior to Visit  Medication Sig Dispense Refill   ALPRAZolam (XANAX) 0.5 MG tablet Take 0.5 mg by mouth at  bedtime as needed for anxiety.     cariprazine (VRAYLAR) capsule Take by mouth.     lamoTRIgine (LAMICTAL) 200 MG tablet Take 200 mg by mouth daily.     ARIPiprazole (ABILIFY) 5 MG tablet Take 1 tablet (5 mg total) by mouth at bedtime. (Patient not taking: Reported on 10/25/2019) 30 tablet 0   busPIRone (BUSPAR) 10 MG tablet Take 1 tablet (10 mg total) by mouth 2 (two) times daily. (Patient not taking: Reported on 10/25/2019) 60 tablet 0   FLUoxetine (PROZAC) 10 MG capsule Take 10 mg by mouth every evening.  (Patient not taking: Reported on 10/25/2019)     hydrOXYzine (ATARAX/VISTARIL) 25 MG tablet Take 1 tablet (25 mg total) by mouth every 6 (six) hours as needed for anxiety. (Patient not taking: Reported on 10/25/2019) 30 tablet 0   traZODone (DESYREL) 50 MG tablet Take 1 tablet (50 mg total) by mouth at bedtime and may repeat dose one time if needed. (Patient not taking: Reported on 10/25/2019) 30 tablet 0   No current facility-administered medications on file prior to visit.    Past Medical History:  Diagnosis Date   Acid reflux disease     History reviewed. No pertinent surgical history.  Social History   Socioeconomic History   Marital status: Single    Spouse name: Not on file   Number of children: Not on file   Years of education: Not on file  Highest education level: Not on file  Occupational History   Not on file  Tobacco Use   Smoking status: Never Smoker   Smokeless tobacco: Never Used  Substance and Sexual Activity   Alcohol use: Not Currently   Drug use: Never   Sexual activity: Yes  Other Topics Concern   Not on file  Social History Narrative   Not on file   Social Determinants of Health   Financial Resource Strain:    Difficulty of Paying Living Expenses: Not on file  Food Insecurity:    Worried About Running Out of Food in the Last Year: Not on file   Ran Out of Food in the Last Year: Not on file  Transportation Needs:    Lack  of Transportation (Medical): Not on file   Lack of Transportation (Non-Medical): Not on file  Physical Activity:    Days of Exercise per Week: Not on file   Minutes of Exercise per Session: Not on file  Stress:    Feeling of Stress : Not on file  Social Connections:    Frequency of Communication with Friends and Family: Not on file   Frequency of Social Gatherings with Friends and Family: Not on file   Attends Religious Services: Not on file   Active Member of Clubs or Organizations: Not on file   Attends Banker Meetings: Not on file   Marital Status: Not on file    Family History  Problem Relation Age of Onset   Thyroid disease Mother    Diabetes Father    Stroke Paternal Grandmother    Diabetes Paternal Grandmother         Review of Systems  Constitutional: Negative for fever, malaise/fatigue and weight loss.  HENT: Negative for congestion and sore throat.   Eyes:       Negative for visual changes  Respiratory: Negative for cough and shortness of breath.   Cardiovascular: Negative for chest pain, palpitations and leg swelling.  Gastrointestinal: Negative for blood in stool, constipation, diarrhea and heartburn.  Genitourinary: Negative for dysuria, frequency and urgency.  Musculoskeletal: Negative for falls, joint pain and myalgias.  Skin: Negative for rash.  Neurological: Negative for dizziness, sensory change and headaches.  Endo/Heme/Allergies: Does not bruise/bleed easily.  Psychiatric/Behavioral: Positive for depression. Negative for substance abuse and suicidal ideas. The patient is nervous/anxious.     Objective:   Vitals:   10/25/19 1337  BP: 110/80  Pulse: 60  Temp: (!) 96.7 F (35.9 C)  SpO2: 100%    Body mass index is 32.77 kg/m.   Physical Examination:  Physical Exam Vitals reviewed.  Constitutional:      General: She is not in acute distress.    Appearance: She is well-developed.  HENT:     Right Ear: Tympanic  membrane, ear canal and external ear normal.     Left Ear: Tympanic membrane, ear canal and external ear normal.  Eyes:     Extraocular Movements: Extraocular movements intact.     Conjunctiva/sclera: Conjunctivae normal.  Cardiovascular:     Rate and Rhythm: Normal rate and regular rhythm.     Heart sounds: Normal heart sounds.  Pulmonary:     Effort: Pulmonary effort is normal. No respiratory distress.     Breath sounds: Normal breath sounds.  Chest:     Chest wall: No tenderness.  Abdominal:     General: Bowel sounds are normal.     Palpations: Abdomen is soft.  Genitourinary:  Comments: Deferred breast and pelvic exam to GYN per patient Musculoskeletal:        General: Normal range of motion.     Cervical back: Normal range of motion and neck supple.  Lymphadenopathy:     Cervical: No cervical adenopathy.  Skin:    General: Skin is warm and dry.     Findings: No erythema or rash.     Comments: Patch of hair loss on right parietal lobe  Neurological:     Mental Status: She is alert and oriented to person, place, and time.     Deep Tendon Reflexes: Reflexes are normal and symmetric.  Psychiatric:        Mood and Affect: Affect is flat.    ASSESSMENT and PLAN: This visit occurred during the SARS-CoV-2 public health emergency.  Safety protocols were in place, including screening questions prior to the visit, additional usage of staff PPE, and extensive cleaning of exam room while observing appropriate contact time as indicated for disinfecting solutions.   Kimberl was seen today for establish care.  Diagnoses and all orders for this visit:  Preventative health care -     CBC with Differential/Platelet -     Comprehensive metabolic panel -     Lipid panel  Encounter for lipid screening for cardiovascular disease -     Lipid panel  Alopecia areata -     TSH -     Ferritin -     Antinuclear Antib (ANA) -     C-reactive protein -     Sedimentation rate  Vitamin D  insufficiency -     Vitamin D 1,25 dihydroxy        Problem List Items Addressed This Visit    None    Visit Diagnoses    Preventative health care    -  Primary   Relevant Orders   CBC with Differential/Platelet   Comprehensive metabolic panel   Lipid panel   Encounter for lipid screening for cardiovascular disease       Relevant Orders   Lipid panel   Alopecia areata       Relevant Orders   TSH   Ferritin   Antinuclear Antib (ANA)   C-reactive protein   Sedimentation rate   Vitamin D insufficiency       Relevant Orders   Vitamin D 1,25 dihydroxy      Follow up: Return if symptoms worsen or fail to improve.  Alysia Penna, NP

## 2019-10-25 NOTE — Patient Instructions (Addendum)
Go to lab for blood draw  Start prenatal vitamin 1tab daily  Maintain appt with dermatology.  Stop use of high heat on hair.  Thank you for choosing [facility] for your health needs.   Alopecia Areata, Adult  Alopecia areata is a condition that causes you to lose hair. You may lose hair on your scalp in patches. In some cases, you may lose all the hair on your scalp (alopecia totalis) or all the hair from your face and body (alopecia universalis). Alopecia areata is an autoimmune disease. This means that your body's defense system (immune system) mistakes normal parts of the body for germs or other things that can make you sick. When you have alopecia areata, the immune system attacks the hair follicles. Alopecia areata usually develops in childhood, but it can develop at any age. For some people, their hair grows back on its own and hair loss does not happen again. For others, their hair may fall out and grow back in cycles. The hair loss may last many years. Having this condition can be emotionally difficult, but it is not dangerous. What are the causes? The cause of this condition is not known. What increases the risk? This condition is more likely to develop in people who have:  A family history of alopecia.  A family history of another autoimmune disease, including type 1 diabetes and rheumatoid arthritis.  Asthma and allergies.  Down syndrome. What are the signs or symptoms? Round spots of patchy hair loss on the scalp is the main symptom of this condition. The spots may be mildly itchy. Other symptoms include:  Short dark hairs in the bald patches that are wider at the top (exclamation point hairs).  Dents, white spots, or lines in the fingernails or toenails.  Balding and body hair loss. This is rare. How is this diagnosed? This condition is diagnosed based on your symptoms and family history. Your health care provider will also check your scalp skin, teeth, and  nails. Your health care provider may refer you to a specialist in hair and skin disorders (dermatologist). You may also have tests, including:  A hair pull test.  Blood tests or other screening tests to check for autoimmune diseases, such as thyroid disease or diabetes.  Skin biopsy to confirm the diagnosis.  A procedure to examine the skin with a lighted magnifying instrument (dermoscopy). How is this treated? There is no cure for alopecia areata. Treatment is aimed at promoting the regrowth of hair and preventing the immune system from overreacting. No single treatment is right for all people with alopecia areata. It depends on the type of hair loss you have and how severe it is. Work with your health care provider to find the best treatment for you. Treatment may include:  Having regular checkups to make sure the condition is not getting worse (watchful waiting).  Steroid creams or pills for 6-8 weeks to stop the immune reaction and help hair to regrow more quickly.  Other topical medicines to alter the immune system response and support the hair growth cycle.  Steroid injections.  Therapy and counseling with a support group or therapist if you are having trouble coping with hair loss. Follow these instructions at home:  Learn as much as you can about your condition.  Apply topical creams only as told by your health care provider.  Take over-the-counter and prescription medicines only as told by your health care provider.  Consider getting a wig or products to  make hair look fuller or to cover bald spots, if you feel uncomfortable with your appearance.  Get therapy or counseling if you are having a hard time coping with hair loss. Ask your health care provider to recommend a counselor or support group.  Keep all follow-up visits as told by your health care provider. This is important. Contact a health care provider if:  Your hair loss gets worse, even with treatment.  You  have new symptoms.  You are struggling emotionally. Summary  Alopecia areata is an autoimmune condition that makes your body's defense system (immune system) attack the hair follicles. This causes you to lose hair.  Treatments may include regular checkups to make sure that the condition is not getting worse (watchful waiting), medicines, and steroid injections. This information is not intended to replace advice given to you by your health care provider. Make sure you discuss any questions you have with your health care provider. Document Revised: 12/12/2016 Document Reviewed: 01/18/2016 Elsevier Patient Education  2020 [employer].

## 2019-10-28 LAB — VITAMIN D 1,25 DIHYDROXY
Vitamin D 1, 25 (OH)2 Total: 53 pg/mL (ref 18–72)
Vitamin D2 1, 25 (OH)2: 43 pg/mL
Vitamin D3 1, 25 (OH)2: 10 pg/mL

## 2019-10-28 LAB — ANA: Anti Nuclear Antibody (ANA): NEGATIVE

## 2022-08-22 ENCOUNTER — Telehealth: Payer: Self-pay | Admitting: Nurse Practitioner

## 2022-08-22 NOTE — Telephone Encounter (Signed)
Patient has not been seen by current PCP in a year. Reached out to the patient to see if they have a new PCP or if they would like to continue care with the provider.  LVM to schedule
